# Patient Record
Sex: Female | Born: 1935 | Race: White | Hispanic: No | Smoking: Never smoker
Health system: Southern US, Community
[De-identification: ages and names within clinical notes are randomized; demographics above are authoritative.]

## PROBLEM LIST (undated history)

## (undated) ENCOUNTER — Emergency Department (HOSPITAL_BASED_OUTPATIENT_CLINIC_OR_DEPARTMENT_OTHER): Discharge status: Absent without leave | Payer: Self-pay

## (undated) DIAGNOSIS — G709 Myoneural disorder, unspecified: Secondary | ICD-10-CM

## (undated) DIAGNOSIS — L723 Sebaceous cyst: Secondary | ICD-10-CM

## (undated) DIAGNOSIS — I639 Cerebral infarction, unspecified: Secondary | ICD-10-CM

## (undated) DIAGNOSIS — F0390 Unspecified dementia without behavioral disturbance: Secondary | ICD-10-CM

## (undated) DIAGNOSIS — I10 Essential (primary) hypertension: Secondary | ICD-10-CM

## (undated) DIAGNOSIS — S065X9A Traumatic subdural hemorrhage with loss of consciousness of unspecified duration, initial encounter: Secondary | ICD-10-CM

## (undated) DIAGNOSIS — G9340 Encephalopathy, unspecified: Secondary | ICD-10-CM

## (undated) DIAGNOSIS — E785 Hyperlipidemia, unspecified: Secondary | ICD-10-CM

## (undated) DIAGNOSIS — S065XAA Traumatic subdural hemorrhage with loss of consciousness status unknown, initial encounter: Secondary | ICD-10-CM

## (undated) DIAGNOSIS — T7840XA Allergy, unspecified, initial encounter: Secondary | ICD-10-CM

## (undated) HISTORY — PX: CYSTECTOMY: SUR359

## (undated) HISTORY — DX: Hyperlipidemia, unspecified: E78.5

## (undated) HISTORY — PX: COLONOSCOPY: SHX174

## (undated) HISTORY — DX: Myoneural disorder, unspecified: G70.9

## (undated) HISTORY — PX: ABDOMINAL HYSTERECTOMY: SHX81

## (undated) HISTORY — DX: Allergy, unspecified, initial encounter: T78.40XA

## (undated) HISTORY — DX: Essential (primary) hypertension: I10

## (undated) HISTORY — DX: Sebaceous cyst: L72.3

## (undated) HISTORY — DX: Cerebral infarction, unspecified: I63.9

---

## 1998-06-01 ENCOUNTER — Other Ambulatory Visit: Admission: RE | Admit: 1998-06-01 | Discharge: 1998-06-01 | Payer: Self-pay | Admitting: Obstetrics and Gynecology

## 1999-04-21 ENCOUNTER — Encounter: Payer: Self-pay | Admitting: Emergency Medicine

## 1999-04-21 ENCOUNTER — Emergency Department (HOSPITAL_COMMUNITY): Admission: EM | Admit: 1999-04-21 | Discharge: 1999-04-21 | Payer: Self-pay

## 1999-10-26 ENCOUNTER — Other Ambulatory Visit: Admission: RE | Admit: 1999-10-26 | Discharge: 1999-10-26 | Payer: Self-pay | Admitting: Obstetrics and Gynecology

## 1999-10-31 ENCOUNTER — Encounter: Payer: Self-pay | Admitting: Obstetrics and Gynecology

## 1999-10-31 ENCOUNTER — Encounter: Admission: RE | Admit: 1999-10-31 | Discharge: 1999-10-31 | Payer: Self-pay | Admitting: Obstetrics and Gynecology

## 2000-11-25 ENCOUNTER — Other Ambulatory Visit: Admission: RE | Admit: 2000-11-25 | Discharge: 2000-11-25 | Payer: Self-pay | Admitting: Obstetrics and Gynecology

## 2001-12-18 ENCOUNTER — Encounter: Payer: Self-pay | Admitting: Obstetrics and Gynecology

## 2001-12-18 ENCOUNTER — Encounter: Admission: RE | Admit: 2001-12-18 | Discharge: 2001-12-18 | Payer: Self-pay | Admitting: Obstetrics and Gynecology

## 2001-12-23 ENCOUNTER — Other Ambulatory Visit: Admission: RE | Admit: 2001-12-23 | Discharge: 2001-12-23 | Payer: Self-pay | Admitting: Obstetrics and Gynecology

## 2002-01-02 ENCOUNTER — Encounter: Payer: Self-pay | Admitting: Obstetrics and Gynecology

## 2002-01-02 ENCOUNTER — Encounter: Admission: RE | Admit: 2002-01-02 | Discharge: 2002-01-02 | Payer: Self-pay | Admitting: Obstetrics and Gynecology

## 2002-08-10 ENCOUNTER — Other Ambulatory Visit: Admission: RE | Admit: 2002-08-10 | Discharge: 2002-08-10 | Payer: Self-pay | Admitting: Hematology & Oncology

## 2002-12-25 ENCOUNTER — Encounter: Payer: Self-pay | Admitting: Obstetrics and Gynecology

## 2002-12-25 ENCOUNTER — Encounter: Admission: RE | Admit: 2002-12-25 | Discharge: 2002-12-25 | Payer: Self-pay | Admitting: Obstetrics and Gynecology

## 2006-09-21 ENCOUNTER — Emergency Department (HOSPITAL_COMMUNITY): Admission: EM | Admit: 2006-09-21 | Discharge: 2006-09-21 | Payer: Self-pay | Admitting: Emergency Medicine

## 2007-06-21 ENCOUNTER — Inpatient Hospital Stay (HOSPITAL_COMMUNITY): Admission: EM | Admit: 2007-06-21 | Discharge: 2007-06-25 | Payer: Self-pay | Admitting: Emergency Medicine

## 2007-06-23 ENCOUNTER — Encounter (INDEPENDENT_AMBULATORY_CARE_PROVIDER_SITE_OTHER): Payer: Self-pay | Admitting: Internal Medicine

## 2007-06-25 ENCOUNTER — Ambulatory Visit: Payer: Self-pay | Admitting: Physical Medicine & Rehabilitation

## 2007-11-21 ENCOUNTER — Ambulatory Visit: Payer: Self-pay | Admitting: Gastroenterology

## 2007-11-26 ENCOUNTER — Ambulatory Visit: Payer: Self-pay | Admitting: Gastroenterology

## 2007-11-26 ENCOUNTER — Encounter: Payer: Self-pay | Admitting: Gastroenterology

## 2007-12-01 ENCOUNTER — Telehealth: Payer: Self-pay | Admitting: Gastroenterology

## 2007-12-22 DIAGNOSIS — E119 Type 2 diabetes mellitus without complications: Secondary | ICD-10-CM | POA: Insufficient documentation

## 2007-12-22 DIAGNOSIS — I10 Essential (primary) hypertension: Secondary | ICD-10-CM | POA: Insufficient documentation

## 2007-12-22 DIAGNOSIS — Z8679 Personal history of other diseases of the circulatory system: Secondary | ICD-10-CM | POA: Insufficient documentation

## 2007-12-24 ENCOUNTER — Ambulatory Visit: Payer: Self-pay | Admitting: Gastroenterology

## 2007-12-24 DIAGNOSIS — R197 Diarrhea, unspecified: Secondary | ICD-10-CM

## 2007-12-24 DIAGNOSIS — L290 Pruritus ani: Secondary | ICD-10-CM

## 2008-03-04 ENCOUNTER — Emergency Department (HOSPITAL_COMMUNITY): Admission: EM | Admit: 2008-03-04 | Discharge: 2008-03-04 | Payer: Self-pay | Admitting: Emergency Medicine

## 2010-12-19 NOTE — Letter (Signed)
November 21, 2007    Larina Earthly, M.D.  [address]  [place], [place] [address]   RE:  Dorothy Page, Dorothy Page  MRN:  [id number]  /  DOB:  1935-11-21   Dear Dr. Felipa Eth:   Upon your kind referral, I had the pleasure of evaluating your patient  and I am pleased to offer my findings.  I saw Dorothy Page in the  office today.  Enclosed is a copy of my progress note that details my  findings and recommendations.   Thank you for the opportunity to participate in your patient's care.    Sincerely,      Barbette Hair. Arlyce Dice, MD,FACG  Electronically Signed    RDK/MedQ  DD: 11/21/2007  DT: 11/21/2007  Job #: [id number]

## 2010-12-19 NOTE — Discharge Summary (Signed)
Dorothy Page, Dorothy Page NO.:  [id number]   MEDICAL RECORD NO.:  [id number]          PATIENT TYPE:  INP   LOCATION:  3019                         FACILITY:  [facility]   PHYSICIAN:  Larina Earthly, M.D.        DATE OF BIRTH:  [birth year]   DATE OF ADMISSION:  06/21/2007  DATE OF DISCHARGE:  06/25/2007                               DISCHARGE SUMMARY   DISCHARGE DIAGNOSES:  1. Likely small vessel infarct on the left not seen on multiple MRIs      associated with hemiballismic movements of the right upper greater      than the right lower extremities and mild short-term memory      deficits currently being discharged on Plavix, workup as below.  2. Type 2 diabetes mellitus uncontrolled on insulin,      thiazolidinedione, and metformin.  3. Hypertension controlled on ACE inhibitor and low-dose thiazide      diuretic.  4. Hyperlipidemia with history of noncompliance with statin therapy,      to be discharged on statin therapy.  5. Mood disorder, to be discharged on Celexa.   DISPOSITION:  Pending physical therapy consultation but with  uncontrolled movements of the right greater than lower extremities with  cessation of driving and work ordered by Dr. Pearlean Brownie for at least 1-2  months pending followup with Dr. Pearlean Brownie on an outpatient basis.  The  patient is to be discharged home with daughter who will provide bedroom  suite on the first floor of her home.   DISCHARGE MEDICATIONS:  1. Plavix 75 mg p.o. daily.  2. Lantus 78 units subcu q.h.s.  3. Lotensin 20 mg in the morning.  4. Hydrochlorothiazide 12.5 mg a q.a.m.  5. Glucophage 1000 mg b.i.d. with an additional 500 mg dose at      lunchtime.  6. Avandia 4 mg p.o. daily.  7. Klonopin 0.5 mg t.i.d.   FOLLOW UP:  The patient will follow up with Dr. Pearlean Brownie in approximately 2-  3 months and with myself, Dr. Felipa Eth, in 1 month.  Phone numbers provided  to the patient and family, and again with restrictions on driving and no  work  for at least 1 month pending further evaluation by Dr. Pearlean Brownie.   DISCHARGE LABS:  Sodium 137, potassium 3.6, BUN 11, creatinine 0.9.  Fasting blood sugar 71.  Total cholesterol 246, with an LDL of 179, HDL  35, and triglycerides 160.  White blood cell count 9.8, hemoglobin 14.7,  hematocrit 42.4%, platelet count 282.  Hemoglobin A1c 8.8%.  TSH 2.054.  B12 normal at 298.  RPR negative.  Homocysteine normal at 10.4.   Brain MRI without contrast on June 23, 2007 reveals no acute  intracranial abnormality, chronic small vessel ischemic disease  suspected with involvement of the cerebral white matter and probable  lacunar infarct in the left globus pallidus.  No definite thalamic  involvement to explain the hemiballismic movement.  MR angiogram  degraded by motion artifact but no major circle of Willis branch  occlusion noted.  CT of the head without contrast on admission to  the  emergency room revealed atrophy appropriate for age with no acute  intracranial abnormality.  2-D echocardiogram pending.  EEG:  Final  report pending.  Carotid Dopplers on June 23, 2007 reveal 46% right  ICA stenosis, mid range of the scale, ECA stenosis, vertebral artery  flow was antegrade, left 60-80% ICA stenosis, low range of scale; and  again, vertebral artery flow is antegrade.   HISTORY AND PHYSICAL:  For history and physical, please see history of  physical dictated by Dr. Guerry Bruin on June 21, 2007 as well as  a consultation by Dr. Deneen Harts on June 21, 2007 for extensive  details.  Briefly, this is a 75 year old Caucasian female with a history  of poorly controlled type 2 diabetes mellitus, hypertension,  hyperlipidemia, followed by research protocol consisting of the ACCORD  trial at [facility].  She was in usual state of  health working part-time at a nursery as an Airline pilot and was out  shopping on the day of admission when family members noted that she  was  having difficulty in moving her right hand that started off as  involuntary jerks and movements.  She was unable to hold a phone.  She  was subsequently taken to [facility] emergency room where neurology declined  to admit but offered to consult, finding that her CT scan and MRI were  negative and that she did not fit code stroke criteria.  The patient was  subsequently admitted for further evaluation and management.  Admission  note also notes considerable amount of stress in the patient's life.   HOSPITAL COURSE:  The patient was admitted and had multiple MRIs with  considerable amounts of movement artifact despite moderate doses of  benzodiazepines to suppress involuntary movements.  Despite these  interventions, MRI failed to yield any evidence of an acute stroke  despite physical exam that suggested the same.  She was started on  Plavix.  It was noted that her cardiovascular risk factors were  uncontrolled, specifically with respect to diabetic control, lipid  control; however, her blood pressure remained stable.  She was placed on  home doses of Lantus insulin with this and adequate caloric intake.  She  did suffer some hypoglycemia, suggesting issues with compliance at home  on her home regimen.  Workup was conducted, as above.  She was followed  by neurology throughout her hospitalization again who noted a likely  small infarct that was not seen on the MRI.  Given some memory  difficulties and continued hemiballismic movements, they thought it was  appropriate that she be discharged on Plavix.  They also recommended PT  evaluation prior to discharge and adequate safety measures for the  patient upon discharge, including no driving and no work for  approximately 1-2 months.  They also recommended Klonopin for the  hemiballismic movements, initially started at b.i.d. treatment and  increasing to 0.5 mg t.i.d.  The patient is now planned for discharge on  June 25, 2007 pending  results of PT consultation, EEG, and 2-D  echocardiogram which have all been performed.      Larina Earthly, M.D.  Electronically Signed     RA/MEDQ  D:  06/24/2007  T:  06/24/2007  Job:  [id number]   cc:   Pramod P. Pearlean Brownie, MD

## 2010-12-19 NOTE — Assessment & Plan Note (Signed)
[facility]                         GASTROENTEROLOGY OFFICE NOTE   Dorothy Page, Dorothy Page                     MRN:          [id number]  DATE:11/21/2007                            DOB:          [birth year]    REFERRING PHYSICIAN:  Larina Earthly, M.D.   REASON FOR CONSULTATION:  Diarrhea.   Dorothy Page is a 75 year old white female, referred through the courtesy  of Dr. Felipa Eth for evaluation.  Since her stroke in November, 2008, she has  developed change in bowel habits, consisting of diarrhea.  On a typical  day, she may have several loose to watery stools.  She has some urgency.  She will occasionally awaken at night to defecate.  There is no history  of melena or hematochezia.  She was started on Aricept at the time of  her stroke, as well as Plavix.  Weight has been stable.  She has been on  no antibiotics.  She also was started on Celexa at the time of her  stroke.   PAST MEDICAL HISTORY:  Pertinent for diabetes.  She has hypertension.  She has had seizures with the stroke.  She is status post hysterectomy.   FAMILY HISTORY:  Pertinent for parents with diabetes.   MEDICATIONS INCLUDE:  Metformin, Zocor, Aricept, Celexa, Plavix and  Lotensin.   ALLERGIES:  She is allergic to ASPIRIN.   SOCIAL HISTORY:  She neither smokes nor drinks.  She is divorced and  retired.   REVIEW OF SYSTEMS:  Was reviewed and is positive for sleeping problems,  excessive thirst, nocturia and some urinary incontinence.   PHYSICAL EXAMINATION:  She is a healthy-appearing female.  Pulse 60, blood pressure 138/70, weight 175.  HEENT: EOMI.  PERRLA.  Sclerae are anicteric.  Conjunctivae are pink.  NECK:  Supple without thyromegaly, adenopathy or carotid bruits.  CHEST:  Clear to auscultation and percussion without adventitious  sounds.  CARDIAC:  Regular rhythm; normal S1 S2.  There are no murmurs, gallops  or rubs.  ABDOMEN:  Bowel sounds are normoactive.  Abdomen is soft,  nontender and  nondistended.  There are no abdominal masses, tenderness, splenic  enlargement or hepatomegaly.  EXTREMITIES:  Full range of motion.  No cyanosis, clubbing or edema.  RECTAL:  Deferred.  SKELETAL EXAM:  There are no skeletal abnormalities.  NEUROLOGIC EXAM:  She is alert and oriented times three and is nonfocal.   IMPRESSION:  Diarrhea.  Her diarrhea may be medicine-related.  Plavix,  Aricept and Celexa were all started at the time of her stroke, which  coincides with the onset of her diarrhea.  A structural lesion of the  colon and microscopic colitis are other possibilities.   RECOMMENDATION:  1. Colonoscopy.  2. We will try holding her medications, one at a time, to see if has      some impact on her diarrhea.     Barbette Hair. Arlyce Dice, MD,FACG  Electronically Signed    RDK/MedQ  DD: 11/21/2007  DT: 11/21/2007  Job #: [id number]   cc:   Larina Earthly, M.D.

## 2010-12-19 NOTE — Consult Note (Signed)
NAMECHANNAH, Dorothy Page NO.:  [id number]   MEDICAL RECORD NO.:  [id number]          PATIENT TYPE:  EMS   LOCATION:  MAJO                         FACILITY:  [facility]   PHYSICIAN:  Bevelyn Buckles. Champey, M.D.DATE OF BIRTH:  23-Mar-1936   DATE OF CONSULTATION:  06/21/2007  DATE OF DISCHARGE:                                 CONSULTATION   NEUROLOGY CONSULTATION:   REQUESTING PHYSICIAN:  Dr. Rosalia Hammers.   REASON FOR CONSULTATION:  Stroke versus seizures.   HISTORY OF PRESENT ILLNESS:  Dorothy Page is a 75 year old Caucasian female  with multiple medical problems who presents with right involuntary  shaking since 5 p.m.  Patient was in her normal state of health and  started feeling lightheaded and nauseated as per daughter.  This led to  involuntary and regular shaking on the right side of her body, more so  on the right arm than leg.  She was given Ativan in the ED, which  greatly improved her symptoms.  She is still slightly having slight,  slowed shaking, questionable choreiform movement, which is greatly  improved as per daughter.  Patient did have head trauma and fall 3 weeks  ago when she fell and hit her head.  She did not seek medical care at  that time.  Daughter states that she has also been slightly confused  with decreased memory as of late.  She denies any headaches, vision  changes, speech changes, swallowing problems, chewing problems, vertigo  or loss of consciousness.   PAST MEDICAL HISTORY:  Past medical history is positive for Guillain-  Barre, hypertension, diabetes.   CURRENT MEDICATIONS:  Current medications include Lantus, metformin,  Lotensin.   ALLERGIES:  ASPIRIN, which causes anaphylaxis.   FAMILY HISTORY:  Family history is positive for hypertension and  strokes.   SOCIAL HISTORY:  Patient lives alone, denies any tobacco, rarely drinks  alcohol.   REVIEW OF SYSTEMS:  Positive as per History of Present Illness.  Review  of systems negative except  as per the History of Present Illness, and  greater than 6 other organ systems.   PHYSICAL EXAMINATION:  VITALS:  Temperature is 97.0, blood pressure is  180/80, pulse is 100, respirations 20, O2 sat is 97%.  HEENT:  Normocephalic, atraumatic.  Extraocular muscles are intact.  Pupils equal, round and reactive to light.  NECK:  Supple, no carotid bruits.  HEART:  Regular.  LUNGS:  Clear.  ABDOMEN:  Soft.  EXTREMITIES:  Good pulses.  NEUROLOGIC:  Patient is awake, alert and oriented.  Language is fluent.  The patient is following commands appropriately.  Cranial nerves II-XII  are grossly intact.  Motor examination:  Patient has good strength and  normal tone in all 4 extremities.  No drift is noted.  Patient  occasionally has slow involuntary movements of the right upper  extremity, greater than the lower extremity, at times.  Physical  examination is within normal limits.  There is no extinction  or neglect  is seen.  Reflexes are 1+ and symmetric.  Cerebellar function is within  normal limits, finger-to-nose, heel-to-shin.  Gait is  not assessed  secondary to safety.  NIH stroke scale is 0.   LABORATORY DATA:  CT of the head showed no acute abnormalities.  MRI of  the brain, limited, showed negative acute infarct on diffusion-weighted  images.  Labs are currently pending.   IMPRESSION:  A 75 year old with right-sided involuntary shaking which is  improved with Ativan.  Patient not a candidate for IV tPA as her  symptoms have improved.  Questionable seizure-like activity, recent head  injury/trauma, NIH stroke scale of 0 with a negative MRI, diffusion.  I  still have a suspicion for possible small vessel left infarct versus  seizures causing her involuntary symptoms.  I recommend repeating her  MRI/MRA of the brain in the a.m. and checking an EEG on Monday.  I would  recommend starting Plavix 75 mg per day as the patient has risk factors  for stroke, and recommend continuing Ativan  as needed for her  involuntary movements.  We will follow the patient as consultants.      Bevelyn Buckles. Nash Shearer, M.D.  Electronically Signed     DRC/MEDQ  D:  06/21/2007  T:  06/21/2007  Job:  [id number]

## 2010-12-19 NOTE — Letter (Signed)
November 21, 2007    Ms. Jeanella Craze   RE:  JRUE, YAMBAO  MRN:  [id number]  /  DOB:  July 22, 1936   Dear Ms. Gane:   It is my pleasure to have treated you recently as a new patient in my  office.  I appreciate your confidence and the opportunity to participate  in your care.   Since I do have a busy inpatient endoscopy schedule and office schedule,  my office hours vary weekly.  I am, however, available for emergency  calls every day through my office.  If I cannot promptly meet an urgent  office appointment, another one of our gastroenterologists will be able  to assist you.   My well-trained staff are prepared to help you at all times.  For  emergencies after office hours, a physician from our gastroenterology  section is always available through my 24-hour answering service.   While you are under my care, I encourage discussion of your questions  and concerns, and I will be happy to return your calls as soon as I am  available.   Once again, I welcome you as a new patient and I look forward to a happy  and healthy relationship.    Sincerely,      Barbette Hair. Arlyce Dice, MD,FACG  Electronically Signed   RDK/MedQ  DD: 11/21/2007  DT: 11/21/2007  Job #: [id number]
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
## 2010-12-19 NOTE — Procedures (Signed)
EEG NUMBER:  [id number]   CLINICAL HISTORY:  The patient is a 75 year old who had difficulty  moving her right hand, involuntary jerks and movement.  The study is  being done to look for the presence of seizures. (781.0)   PROCEDURE:  The tracing is carried out on a 32-channel digital Cadwell  recorder, re-formatted into 16 channel montages, with one devoted to  EKG.  The patient is awake and drowsy during the recording.   MEDICATIONS:  Include NovoLog, Lotensin, hydrochlorothiazide, Protonix,  Plavix, Glucophage, Lantus, Zocor and Ativan.  The [employer] 10-  system lead placement was used.   DESCRIPTION OF FINDINGS:  Dominant frequency is an 8-Hz, 25 to 30-  microvolt activity, with superimposed upper theta range activity.  Toward the end of the record, the patient has predominantly theta range  activity associated with sleep spindles.  Photic stimulation and  hyperventilation caused little change in background.   EKG showed a regular sinus rhythm with a ventricular response of 72  beats per minute.   IMPRESSION:  Normal record, awake and asleep.      Deanna Artis. Sharene Skeans, M.D.  Electronically Signed     ZOX:WRUE  D:  06/23/2007 23:35:25  T:  06/24/2007 12:44:18  Job #:  [id number]   cc:   Gaspar Garbe, M.D.  Fax: [phone]

## 2010-12-22 NOTE — Discharge Summary (Signed)
Dorothy Page, ROWTON NO.:  [id number]   MEDICAL RECORD NO.:  [id number]          PATIENT TYPE:  INP   LOCATION:  3019                         FACILITY:  [facility]   PHYSICIAN:  Larina Earthly, M.D.        DATE OF BIRTH:  October 18, 1935   DATE OF ADMISSION:  06/21/2007  DATE OF DISCHARGE:  06/25/2007                               DISCHARGE SUMMARY   ADDENDUM   The patient was seen by physical therapy and given multiple falls and  instability with her gait as well as short-term memory deficits and  difficulty onto standing safety instructions, their recommendation was  that she have 24-hour supervision upon discharge or be admitted to a  rehab facility.  She did have a rehab consult.  After talking with  family, the social worker realized that this patient will have newly  arranged 24-hour supervision at home with the daughter as previously  dictated.  They also further recommend discharge with speech therapy  with occupational and physical therapy in addition to her 24-hour  supervision.  This was performed on the evening June 25, 2007.  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 and suggested changing over to Haldol 0.5 mg  p.o. t.i.d.  The prescriptions were called into [facility] on [address] consisting of Haldol 0.5 mg one p.o. every 8 hours scheduled (#30  with one refill), Ativan 0.5 mg every 8 hours p.r.n. for anxiety  disorder and mood disorder (#30 with one refill), Plavix 75 mg each day  (#30 with six refills) and Celexa 20 mg each day (#30 with six refills).  The patient will followup as previously dictated.      Larina Earthly, M.D.  Electronically Signed     RA/MEDQ  D:  06/26/2007  T:  06/27/2007  Job:  [id number]

## 2011-02-09 ENCOUNTER — Ambulatory Visit (INDEPENDENT_AMBULATORY_CARE_PROVIDER_SITE_OTHER): Payer: Medicare HMO | Admitting: Surgery

## 2011-02-09 ENCOUNTER — Encounter (INDEPENDENT_AMBULATORY_CARE_PROVIDER_SITE_OTHER): Payer: Self-pay | Admitting: Surgery

## 2011-02-09 VITALS — BP 154/86 | HR 66 | Temp 95.6°F | Ht 65.0 in | Wt 167.0 lb

## 2011-02-09 DIAGNOSIS — L723 Sebaceous cyst: Secondary | ICD-10-CM

## 2011-02-09 NOTE — Patient Instructions (Signed)
Remove packing tomorrow and treat the wound daily with Neosporin.  Take the antibiotics prescribed by Dr. Felipa Eth.

## 2011-02-09 NOTE — Progress Notes (Signed)
Chief Complaint  Patient presents with  . Abscess    Right breast   History of present illness This is a 75 year old female who presents with a 10 year history of a slowly enlarging mass on the medial part of her right breast near the sternum. This has never previously become infected. She has had annual mammograms that never revealed any abnormalities in this area. She states that about 8 years ago it did burst, draining some whitish fluid. However over the last few days it has become inflamed and very tender. She was evaluated by Dr. Felipa Eth earlier today who referred her to the urgent office.  Past Medical History  Diagnosis Date  . Diabetes mellitus   . Allergy   . Stroke   . Neuromuscular disorder     mild paralysis  . Hypertension   . Hyperlipidemia     borderline   Past Surgical History  Procedure Date  . Abdominal hysterectomy   . Colonoscopy    Allergies  Allergen Reactions  . Aspirin     REACTION: swelling   See medication list  On physical examination, the patient has a 3 cm erythematous protruding mass on the right chest at the medial edge of the right breast. This appears to be an infected sebaceous cyst.  Impression: Infected sebaceous cyst right chest Plan: We prepped this area with Betadine and anesthetized with 1% lidocaine. I made a 1 cm transverse incision. A large amount of purulent drainage and sebum was expressed from the wound. Part of the cyst wall also was removed. The wound was packed with quarter inch Nu Gauze. The patient is instructed to remove the Nu Gauze tomorrow and to treat this area with Neosporin ointment. We will reevaluate her in 2 weeks. She should take the antibiotics prescribed to her earlier today.

## 2011-02-20 ENCOUNTER — Ambulatory Visit (INDEPENDENT_AMBULATORY_CARE_PROVIDER_SITE_OTHER): Payer: Medicare HMO | Admitting: Surgery

## 2011-02-20 ENCOUNTER — Encounter (INDEPENDENT_AMBULATORY_CARE_PROVIDER_SITE_OTHER): Payer: Self-pay | Admitting: Surgery

## 2011-02-20 VITALS — Temp 98.2°F

## 2011-02-20 DIAGNOSIS — L723 Sebaceous cyst: Secondary | ICD-10-CM

## 2011-02-20 DIAGNOSIS — L089 Local infection of the skin and subcutaneous tissue, unspecified: Secondary | ICD-10-CM

## 2011-02-20 NOTE — Patient Instructions (Signed)
Remove packing tomorrow.  Every day, use a cotton swab with some Neosporin.  Twirl the cotton swab in the wound, then cover the wound with a dry dressing.

## 2011-02-20 NOTE — Progress Notes (Signed)
Status post drainage of an infected sebaceous cyst on her chest. The area is much smaller but the skin edges seem to have almost healed up completely. There is a tiny bit of drainage from a small pinhole. We packed this wound with Betadine and anesthetized with 1% lidocaine. I opened the incision and we expressed a little bit more infected sebum. I debrided some of the cyst wall. We packed the wound with quarter-inch Nu Gauze. She is to remove this tomorrow and treat the wound with a cotton swab and Neosporin. Recheck in one to 2 weeks.

## 2011-03-01 ENCOUNTER — Encounter (INDEPENDENT_AMBULATORY_CARE_PROVIDER_SITE_OTHER): Payer: Self-pay

## 2011-03-01 ENCOUNTER — Encounter (INDEPENDENT_AMBULATORY_CARE_PROVIDER_SITE_OTHER): Payer: Medicare HMO | Admitting: Surgery

## 2011-03-01 ENCOUNTER — Ambulatory Visit (INDEPENDENT_AMBULATORY_CARE_PROVIDER_SITE_OTHER): Payer: Medicare HMO | Admitting: Surgery

## 2011-03-01 ENCOUNTER — Encounter (INDEPENDENT_AMBULATORY_CARE_PROVIDER_SITE_OTHER): Payer: Self-pay | Admitting: Surgery

## 2011-03-01 DIAGNOSIS — L723 Sebaceous cyst: Secondary | ICD-10-CM

## 2011-03-01 DIAGNOSIS — L089 Local infection of the skin and subcutaneous tissue, unspecified: Secondary | ICD-10-CM

## 2011-03-01 NOTE — Progress Notes (Signed)
The patient returns for recheck of her sebaceous cyst. The wound is completely healed with no sign of infection. She still has a residual cyst measuring about 2 cm across. We will plan to excise this completely while it is not inflamed or infected. We will schedule her for an office procedure in 2 weeks. No need for any dressings at this time.

## 2011-03-13 ENCOUNTER — Ambulatory Visit (INDEPENDENT_AMBULATORY_CARE_PROVIDER_SITE_OTHER): Payer: Medicare HMO | Admitting: Surgery

## 2011-03-13 ENCOUNTER — Ambulatory Visit (INDEPENDENT_AMBULATORY_CARE_PROVIDER_SITE_OTHER): Payer: Medicare HMO

## 2011-03-13 DIAGNOSIS — L723 Sebaceous cyst: Secondary | ICD-10-CM

## 2011-03-13 MED ORDER — HYDROCODONE-ACETAMINOPHEN 5-500 MG PO TABS: 1.0000 | ORAL_TABLET | ORAL | Status: DC | PRN

## 2011-03-13 NOTE — Patient Instructions (Signed)
Remove plastic dressing and gauze on Thursday.  Then you may shower over the steristrips.

## 2011-03-13 NOTE — Progress Notes (Signed)
Excision of sebaceous cyst - chest I prepped with Betadine and anesthetized with 1% Lidocaine.  A 2-cm transverse incision was made.  The cyst cavity still has some purulent fluid and some sebum. We debrided most of the cyst wall.  The wound was closed with subcuticular 4-0 Monocryl, Benzoin, and steri-strips.  A tegaderm dressing was applied.  Follow-up in 1 week.

## 2011-03-19 ENCOUNTER — Ambulatory Visit (INDEPENDENT_AMBULATORY_CARE_PROVIDER_SITE_OTHER): Payer: Medicare HMO | Admitting: Surgery

## 2011-03-19 ENCOUNTER — Encounter (INDEPENDENT_AMBULATORY_CARE_PROVIDER_SITE_OTHER): Payer: Self-pay | Admitting: Surgery

## 2011-03-19 DIAGNOSIS — L723 Sebaceous cyst: Secondary | ICD-10-CM

## 2011-03-19 DIAGNOSIS — L089 Local infection of the skin and subcutaneous tissue, unspecified: Secondary | ICD-10-CM

## 2011-03-19 NOTE — Patient Instructions (Signed)
  Remove the steri-strips in one week

## 2011-03-19 NOTE — Progress Notes (Signed)
Wound check of the excision of her sebaceous cyst. The wound seems to be healing well with no sign of infection. Minimal swelling around the incision. A left stationed in place for one more week. She may return on a p.r.n. basis.

## 2011-05-15 LAB — BASIC METABOLIC PANEL
BUN: 10
BUN: 11
CO2: 27
Calcium: 8.9
Chloride: 109
Creatinine, Ser: 0.69
Creatinine, Ser: 0.91
GFR calc Af Amer: >60
GFR calc non Af Amer: >60
GFR calc non Af Amer: >60
GFR calc non Af Amer: >60
Glucose, Bld: 56 — ABNORMAL LOW
Glucose, Bld: 69 — ABNORMAL LOW
Potassium: 3.5
Sodium: 141

## 2011-05-15 LAB — DIFFERENTIAL: Neutrophils Relative %: 85 — ABNORMAL HIGH

## 2011-05-15 LAB — LIPID PANEL
HDL: 35 — ABNORMAL LOW
Triglycerides: 160 — ABNORMAL HIGH
VLDL: 32

## 2011-05-15 LAB — VITAMIN B12: Vitamin B-12: 298 (ref 211–911)

## 2011-05-15 LAB — PROTIME-INR: Prothrombin Time: 12.8

## 2011-05-15 LAB — HEMOGLOBIN A1C: Hgb A1c MFr Bld: 8.8 — ABNORMAL HIGH

## 2011-05-15 LAB — HOMOCYSTEINE: Homocysteine: 10.4

## 2011-05-15 LAB — COMPREHENSIVE METABOLIC PANEL
ALT: 30
AST: 36
Albumin: 3.7
Alkaline Phosphatase: 85
CO2: 25
Calcium: 8.9
Chloride: 106
Creatinine, Ser: 0.71
Glucose, Bld: 167 — ABNORMAL HIGH
Potassium: 4.1
Sodium: 138
Total Protein: 6.8

## 2011-05-15 LAB — CBC
HCT: 42.4
HCT: 42.8
Hemoglobin: 15.2 — ABNORMAL HIGH
MCHC: 33.8
MCV: 87.6
Platelets: 262
RDW: 13.9
WBC: 9.8

## 2011-05-15 LAB — TSH: TSH: 2.054

## 2011-05-15 LAB — CK TOTAL AND CKMB (NOT AT ARMC)
Relative Index: 1.5
Total CK: 236 — ABNORMAL HIGH

## 2013-11-27 ENCOUNTER — Other Ambulatory Visit: Payer: Self-pay | Admitting: Internal Medicine

## 2013-11-27 DIAGNOSIS — Z1231 Encounter for screening mammogram for malignant neoplasm of breast: Secondary | ICD-10-CM

## 2013-12-07 ENCOUNTER — Ambulatory Visit: Payer: Self-pay

## 2014-04-19 ENCOUNTER — Encounter: Payer: Self-pay | Admitting: Gastroenterology

## 2014-12-19 ENCOUNTER — Other Ambulatory Visit (HOSPITAL_COMMUNITY): Payer: Self-pay

## 2014-12-19 ENCOUNTER — Inpatient Hospital Stay (HOSPITAL_COMMUNITY)
Admission: EM | Admit: 2014-12-19 | Discharge: 2014-12-21 | DRG: 085 | Disposition: A | Discharge status: Skilled Nursing Facility | Payer: Commercial Managed Care - HMO | Attending: Internal Medicine | Admitting: Internal Medicine

## 2014-12-19 ENCOUNTER — Encounter (HOSPITAL_COMMUNITY): Payer: Self-pay | Admitting: Emergency Medicine

## 2014-12-19 ENCOUNTER — Emergency Department (HOSPITAL_COMMUNITY): Payer: Commercial Managed Care - HMO

## 2014-12-19 DIAGNOSIS — R2681 Unsteadiness on feet: Secondary | ICD-10-CM | POA: Diagnosis present

## 2014-12-19 DIAGNOSIS — E1165 Type 2 diabetes mellitus with hyperglycemia: Secondary | ICD-10-CM | POA: Diagnosis present

## 2014-12-19 DIAGNOSIS — W19XXXA Unspecified fall, initial encounter: Secondary | ICD-10-CM | POA: Diagnosis present

## 2014-12-19 DIAGNOSIS — I6202 Nontraumatic subacute subdural hemorrhage: Secondary | ICD-10-CM

## 2014-12-19 DIAGNOSIS — S065X9A Traumatic subdural hemorrhage with loss of consciousness of unspecified duration, initial encounter: Secondary | ICD-10-CM | POA: Diagnosis present

## 2014-12-19 DIAGNOSIS — I10 Essential (primary) hypertension: Secondary | ICD-10-CM | POA: Diagnosis present

## 2014-12-19 DIAGNOSIS — S065X0A Traumatic subdural hemorrhage without loss of consciousness, initial encounter: Secondary | ICD-10-CM | POA: Diagnosis not present

## 2014-12-19 DIAGNOSIS — Z79899 Other long term (current) drug therapy: Secondary | ICD-10-CM

## 2014-12-19 DIAGNOSIS — R4182 Altered mental status, unspecified: Secondary | ICD-10-CM | POA: Diagnosis present

## 2014-12-19 DIAGNOSIS — G9340 Encephalopathy, unspecified: Secondary | ICD-10-CM | POA: Diagnosis present

## 2014-12-19 DIAGNOSIS — F0390 Unspecified dementia without behavioral disturbance: Secondary | ICD-10-CM | POA: Diagnosis present

## 2014-12-19 DIAGNOSIS — Y92009 Unspecified place in unspecified non-institutional (private) residence as the place of occurrence of the external cause: Secondary | ICD-10-CM

## 2014-12-19 DIAGNOSIS — Y92129 Unspecified place in nursing home as the place of occurrence of the external cause: Secondary | ICD-10-CM

## 2014-12-19 DIAGNOSIS — Z8673 Personal history of transient ischemic attack (TIA), and cerebral infarction without residual deficits: Secondary | ICD-10-CM | POA: Diagnosis not present

## 2014-12-19 DIAGNOSIS — E538 Deficiency of other specified B group vitamins: Secondary | ICD-10-CM | POA: Diagnosis present

## 2014-12-19 DIAGNOSIS — Z7902 Long term (current) use of antithrombotics/antiplatelets: Secondary | ICD-10-CM | POA: Diagnosis not present

## 2014-12-19 DIAGNOSIS — E785 Hyperlipidemia, unspecified: Secondary | ICD-10-CM | POA: Diagnosis present

## 2014-12-19 DIAGNOSIS — S065XAA Traumatic subdural hemorrhage with loss of consciousness status unknown, initial encounter: Secondary | ICD-10-CM | POA: Diagnosis present

## 2014-12-19 HISTORY — DX: Unspecified dementia, unspecified severity, without behavioral disturbance, psychotic disturbance, mood disturbance, and anxiety: F03.90

## 2014-12-19 LAB — BASIC METABOLIC PANEL
Anion gap: 12 (ref 5–15)
BUN: 9 mg/dL (ref 6–20)
CHLORIDE: 105 mmol/L (ref 101–111)
CO2: 24 mmol/L (ref 22–32)
CREATININE: 0.93 mg/dL (ref 0.44–1.00)
Calcium: 9.3 mg/dL (ref 8.9–10.3)
GFR calc Af Amer: >60 mL/min (ref >60)
GFR calc non Af Amer: 58 mL/min — ABNORMAL LOW (ref >60)
GLUCOSE: 210 mg/dL — AB (ref 65–99)
Potassium: 4.2 mmol/L (ref 3.5–5.1)
Sodium: 141 mmol/L (ref 135–145)

## 2014-12-19 LAB — CBC WITH DIFFERENTIAL/PLATELET
BASOS ABS: 0 10*3/uL (ref 0.0–0.1)
BASOS PCT: 0 % (ref 0–1)
EOS PCT: 3 % (ref 0–5)
Eosinophils Absolute: 0.2 10*3/uL (ref 0.0–0.7)
HCT: 43 % (ref 36.0–46.0)
Hemoglobin: 14.4 g/dL (ref 12.0–15.0)
Lymphocytes Relative: 37 % (ref 12–46)
Lymphs Abs: 3.1 10*3/uL (ref 0.7–4.0)
MCH: 29.6 pg (ref 26.0–34.0)
MCHC: 33.5 g/dL (ref 30.0–36.0)
MCV: 88.3 fL (ref 78.0–100.0)
MONO ABS: 0.5 10*3/uL (ref 0.1–1.0)
Monocytes Relative: 6 % (ref 3–12)
Neutro Abs: 4.5 10*3/uL (ref 1.7–7.7)
Neutrophils Relative %: 54 % (ref 43–77)
PLATELETS: 212 10*3/uL (ref 150–400)
RBC: 4.87 MIL/uL (ref 3.87–5.11)
RDW: 13.1 % (ref 11.5–15.5)
WBC: 8.3 10*3/uL (ref 4.0–10.5)

## 2014-12-19 LAB — VITAMIN B12: Vitamin B-12: 118 pg/mL — ABNORMAL LOW (ref 180–914)

## 2014-12-19 LAB — URINALYSIS, ROUTINE W REFLEX MICROSCOPIC
Bilirubin Urine: NEGATIVE
Glucose, UA: 100 mg/dL — AB
HGB URINE DIPSTICK: NEGATIVE
KETONES UR: NEGATIVE mg/dL
NITRITE: NEGATIVE
PH: 7 (ref 5.0–8.0)
Protein, ur: NEGATIVE mg/dL
SPECIFIC GRAVITY, URINE: 1.009 (ref 1.005–1.030)
Urobilinogen, UA: 0.2 mg/dL (ref 0.0–1.0)

## 2014-12-19 LAB — URINE MICROSCOPIC-ADD ON

## 2014-12-19 LAB — RAPID URINE DRUG SCREEN, HOSP PERFORMED
AMPHETAMINES: NOT DETECTED
BENZODIAZEPINES: NOT DETECTED
Barbiturates: NOT DETECTED
Cocaine: NOT DETECTED
OPIATES: NOT DETECTED
Tetrahydrocannabinol: NOT DETECTED

## 2014-12-19 LAB — CK: Total CK: 219 U/L (ref 38–234)

## 2014-12-19 LAB — GLUCOSE, CAPILLARY
Glucose-Capillary: 146 mg/dL — ABNORMAL HIGH (ref 65–99)
Glucose-Capillary: 171 mg/dL — ABNORMAL HIGH (ref 65–99)

## 2014-12-19 LAB — TSH: TSH: 3.094 u[IU]/mL (ref 0.350–4.500)

## 2014-12-19 LAB — TROPONIN I: Troponin I: <0.03 ng/mL (ref <0.031)

## 2014-12-19 LAB — AMMONIA: Ammonia: 21 umol/L (ref 9–35)

## 2014-12-19 MED ORDER — MEMANTINE HCL ER 28 MG PO CP24
28.0000 mg | ORAL_CAPSULE | Freq: Every day | ORAL | Status: DC
  Administered 2014-12-19 – 2014-12-21 (×3): 28 mg via ORAL
  Filled 2014-12-19 (×3): qty 1

## 2014-12-19 MED ORDER — HALOPERIDOL LACTATE 5 MG/ML IJ SOLN: 5.0000 mg | Freq: Four times a day (QID) | INTRAMUSCULAR | Status: DC | PRN

## 2014-12-19 MED ORDER — SODIUM CHLORIDE 0.9 % IJ SOLN
3.0000 mL | Freq: Two times a day (BID) | INTRAMUSCULAR | Status: DC
  Administered 2014-12-19 – 2014-12-20 (×2): 3 mL via INTRAVENOUS

## 2014-12-19 MED ORDER — ACETAMINOPHEN 325 MG PO TABS: 650.0000 mg | ORAL_TABLET | Freq: Four times a day (QID) | ORAL | Status: DC | PRN

## 2014-12-19 MED ORDER — IRBESARTAN 150 MG PO TABS
75.0000 mg | ORAL_TABLET | Freq: Every day | ORAL | Status: DC
  Administered 2014-12-19 – 2014-12-21 (×3): 75 mg via ORAL
  Filled 2014-12-19 (×3): qty 1

## 2014-12-19 MED ORDER — INSULIN ASPART 100 UNIT/ML ~~LOC~~ SOLN
0.0000 [IU] | Freq: Three times a day (TID) | SUBCUTANEOUS | Status: DC
  Administered 2014-12-19: 1 [IU] via SUBCUTANEOUS
  Administered 2014-12-20 (×3): 2 [IU] via SUBCUTANEOUS
  Administered 2014-12-21: 3 [IU] via SUBCUTANEOUS

## 2014-12-19 MED ORDER — ONDANSETRON HCL 4 MG PO TABS: 4.0000 mg | ORAL_TABLET | Freq: Four times a day (QID) | ORAL | Status: DC | PRN

## 2014-12-19 MED ORDER — ACETAMINOPHEN 650 MG RE SUPP: 650.0000 mg | Freq: Four times a day (QID) | RECTAL | Status: DC | PRN

## 2014-12-19 MED ORDER — ONDANSETRON HCL 4 MG/2ML IJ SOLN: 4.0000 mg | Freq: Four times a day (QID) | INTRAMUSCULAR | Status: DC | PRN

## 2014-12-19 MED ORDER — ESCITALOPRAM OXALATE 10 MG PO TABS
5.0000 mg | ORAL_TABLET | ORAL | Status: DC
  Administered 2014-12-20 – 2014-12-21 (×2): 5 mg via ORAL
  Filled 2014-12-19 (×2): qty 1

## 2014-12-19 NOTE — Discharge Instructions (Signed)
You need to have your CT scan repeated in 10-14 days. Call Dr. Cassandria SanteeBotero's office to set that up.   Fall Prevention and Home Safety Falls cause injuries and can affect all age groups. It is possible to use preventive measures to significantly decrease the likelihood of falls. There are many simple measures which can make your home safer and prevent falls. OUTDOORS  Repair cracks and edges of walkways and driveways.  Remove high doorway thresholds.  Trim shrubbery on the main path into your home.  Have good outside lighting.  Clear walkways of tools, rocks, debris, and clutter.  Check that handrails are not broken and are securely fastened. Both sides of steps should have handrails.  Have leaves, snow, and ice cleared regularly.  Use sand or salt on walkways during winter months.  In the garage, clean up grease or oil spills. BATHROOM  Install night lights.  Install grab bars by the toilet and in the tub and shower.  Use non-skid mats or decals in the tub or shower.  Place a plastic non-slip stool in the shower to sit on, if needed.  Keep floors dry and clean up all water on the floor immediately.  Remove soap buildup in the tub or shower on a regular basis.  Secure bath mats with non-slip, double-sided rug tape.  Remove throw rugs and tripping hazards from the floors. BEDROOMS  Install night lights.  Make sure a bedside light is easy to reach.  Do not use oversized bedding.  Keep a telephone by your bedside.  Have a firm chair with side arms to use for getting dressed.  Remove throw rugs and tripping hazards from the floor. KITCHEN  Keep handles on pots and pans turned toward the center of the stove. Use back burners when possible.  Clean up spills quickly and allow time for drying.  Avoid walking on wet floors.  Avoid hot utensils and knives.  Position shelves so they are not too high or low.  Place commonly used objects within easy reach.  If  necessary, use a sturdy step stool with a grab bar when reaching.  Keep electrical cables out of the way.  Do not use floor polish or wax that makes floors slippery. If you must use wax, use non-skid floor wax.  Remove throw rugs and tripping hazards from the floor. STAIRWAYS  Never leave objects on stairs.  Place handrails on both sides of stairways and use them. Fix any loose handrails. Make sure handrails on both sides of the stairways are as long as the stairs.  Check carpeting to make sure it is firmly attached along stairs. Make repairs to worn or loose carpet promptly.  Avoid placing throw rugs at the top or bottom of stairways, or properly secure the rug with carpet tape to prevent slippage. Get rid of throw rugs, if possible.  Have an electrician put in a light switch at the top and bottom of the stairs. OTHER FALL PREVENTION TIPS  Wear low-heel or rubber-soled shoes that are supportive and fit well. Wear closed toe shoes.  When using a stepladder, make sure it is fully opened and both spreaders are firmly locked. Do not climb a closed stepladder.  Add color or contrast paint or tape to grab bars and handrails in your home. Place contrasting color strips on first and last steps.  Learn and use mobility aids as needed. Install an electrical emergency response system.  Turn on lights to avoid dark areas. Replace light bulbs that  burn out immediately. Get light switches that glow.  Arrange furniture to create clear pathways. Keep furniture in the same place.  Firmly attach carpet with non-skid or double-sided tape.  Eliminate uneven floor surfaces.  Select a carpet pattern that does not visually hide the edge of steps.  Be aware of all pets. OTHER HOME SAFETY TIPS  Set the water temperature for 120 F (48.8 C).  Keep emergency numbers on or near the telephone.  Keep smoke detectors on every level of the home and near sleeping areas. Document Released: 07/13/2002  Document Revised: 01/22/2012 Document Reviewed: 10/12/2011 [facility] Patient Information 2015 [place], [place]. This information is not intended to replace advice given to you by your health care provider. Make sure you discuss any questions you have with your health care provider.  Subdural Hematoma A subdural hematoma is a collection of blood between the brain and its tough outermost membrane covering (the dura). Blood clots that form in this area push down on the brain and cause irritation. A subdural hematoma may cause parts of the brain to stop working and eventually cause death.  CAUSES A subdural hematoma is caused by bleeding from a ruptured blood vessel (hemorrhage). The bleeding results from trauma to the head, such as from a fall or motor vehicle accident. There are two types of subdural hemorrhages:  Acute. This type develops shortly after a serious blow to the head and causes blood to collect very quickly. If not diagnosed and treated promptly, severe brain injury or death can occur.  Chronic. This is when bleeding develops more slowly, over weeks or months. RISK FACTORS People at risk for subdural hematoma include older persons, infants, and alcoholics. SYMPTOMS An acute subdural hemorrhage develops over minutes to hours. Symptoms can include:  Temporary loss of consciousness.  Weakness of arms or legs on one side of the body.  Changes in vision or speech.  A severe headache.  Seizures.  Nausea and vomiting.  Increased sleepiness. A chronic subdural hemorrhage develops over weeks to months. Symptoms may develop slowly and produce less noticeable problems or changes. Symptoms include:  A mild headache.  A change in personality.  Loss of balance or difficulty walking.  Weakness, numbness, or tingling in the arms or legs.  Nausea or vomiting.  Memory loss.  Double vision.  Increased sleepiness. DIAGNOSIS Your health care provider will perform a thorough  physical and neurological exam. A CT scan or MRI may also be done. If there is blood on the scan, its color will help your health care provider determine how long the hemorrhage has been there. TREATMENT If the cause is an acute subdural hemorrhage, immediate treatment is needed. In many cases an emergency surgery is performed to drain accumulated blood or to remove the blood clot. Sometimes steroid or diuretic medicines or controlled breathing through a ventilator is needed to decrease pressure in the brain. This is especially true if there is any swelling of the brain. If the cause is a chronic subdural hemorrhage, treatment depends on a variety of factors. Sometimes no treatment is needed. If the subdural hematoma is small and causes minimal or no symptoms, you may be treated with bed rest, medicines, and observation. If the hemorrhage is large or if you have neurological symptoms, an emergency surgery is usually needed to remove the blood clot. People who develop a subdural hemorrhage are at risk of developing seizures, even after the subdural hematoma has been treated. You may be prescribed an anti-seizure (anticonvulsant) medicine for  a year or longer. HOME CARE INSTRUCTIONS  Only take medicines as directed by your health care provider.  Rest if directed by your health care provider.  Keep all follow-up appointments with your health care provider.  If you play a contact sport such as football, hockey or soccer and you experienced a significant head injury, allow enough time for healing (up to 15 days) before you start playing again. A repeated injury that occurs during this fragile repair period is likely to result in hemorrhage. This is called the second impact syndrome. SEEK IMMEDIATE MEDICAL CARE IF:  You fall or experience minor trauma to your head and you are taking blood thinners. If you are on any blood thinners even a very small injury can cause a subdural hematoma. You should not  hesitate to seek medical attention regardless of how minor you think your symptoms are.  You experience a head injury and have:  Drowsiness or a decrease in alertness.  Confusion or forgetfulness.  Slurred speech.  Irrational or aggressive behavior.  Numbness or paralysis in any part of the body.  A feeling of being sick to your stomach (nauseous) or you throw up (vomit).  Difficulty walking or poor coordination.  Double vision.  Seizures.  A bleeding disorder.  A history of heavy alcohol use.  Clear fluid draining from your nose or ears.  Personality changes.  Difficulty thinking.  Worsening symptoms. MAKE SURE YOU:  Understand these instructions.  Will watch your condition.  Will get help right away if you are not doing well or get worse. FOR MORE INFORMATION National Institute of Neurological Disorders and Stroke: www.ninds.nih.gov American Association of Neurological Surgeons: www.neurosurgerytoday.orToledoAutomobile.co.ukg American Academy of Neurology (AAN): ComparePet.czwww.aan.com Brain Injury Association of America: www.biausa.org Document Released: 06/09/2004 Document Revised: 05/13/2013 Document Reviewed: 01/23/2013 [facility] Patient Information 2015 [place], [place]. This information is not intended to replace advice given to you by your health care provider. Make sure you discuss any questions you have with your health care provider.

## 2014-12-19 NOTE — H&P (Signed)
History and Physical  Dorothy Page AOZ:[id number] DOB: 1935/11/01 DOA: 12/19/2014   PCP: Hoyle SauerAVVA,RAVISANKAR R, MD  Referring Physician: ED/ Drs. Wofford and Agricultural engineerGlick  Chief Complaint: falls with cognitive decline  HPI:  79 year old female with a history of diabetes mellitus, stroke, hypertension, hyperlipidemia presented after a mechanical fall on the morning of admission. The patient has a history of dementia with cognitive decline and is unable to provide any significant history. All of this history is obtained from review of the medical record and speaking with the patient's daughter at the bedside. The patient's daughter stated that the patient had a mechanical fall approximately 2 weeks prior to this admission. She was evaluated by EMS at that time and was deemed to be stable without transferring to the hospital. The patient's daughter stated that for 2 days after mechanical fall the patient was in her usual mental status. However, on the third day the patient began making a cognitive and functional decline. Daughter stated that the patient has been more confused since then, and she has had more difficulty with and unsteady gait.  Approximately one week prior to this admission, the patient got locked out of her independent living facility, and the patient could not figure out how to use her daily to get back in.  Apparently, the patient had a mechanical fall on the morning of admission which prompted her to be brought to the emergency department. CT of the brain in the emergency department revealed a subacute subdural hematoma.  There've not been any reports of fevers, chills, chest pain, shortness breath, vomiting, diarrhea, abdominal pain.   In the emergency department, the patient was afebrile and hemodynamically stable. In fact she was hypertensive with blood pressure of 197/104. BMP and CBC were unremarkable. EKG shows sinus rhythm without any ST-T wave changes. Chest x-ray was negative. CT  of the brain showed a left parietal subacute subdural hematoma.  Assessment/Plan: acute encephalopathy -The patient's cognitive and functional decline in the past 1-2 weeks is likely from a subdural hematoma that the patient may have developed after her initial mechanical fall 2 weeks prior to this admission. -Will check TSH, ammonia, RPR, B12 -Urinalysis negative for significant pyuria -Certainly, there may be a component of hypertensive encephalopathy with the patient's uncontrolled blood pressure Subdural hematoma -Neurosurgery, Dr. Jeral FruitBotero was consulted in ED-->no surgical intervention needed -PT/OT evaluation Unsteady gait/mechanical falls -PT/OT evaluation -Check CPK -Check orthostatic vital signs -attempts in the emergency department to get the patient up, but the patient was extremely unsteady Dementia -Continue Namenda -Held all when necessary agitation History of stroke -Hold Plavix in the setting of subdural hematoma Hypertension -Restart Avapro, but suspect the patient will need additional antihypertensive agents Diabetes mellitus type 2 -Discontinue metformin -Hemoglobin A1c -NovoLog sliding scale       Past Medical History  Diagnosis Date  . Diabetes mellitus   . Allergy   . Stroke   . Neuromuscular disorder     mild paralysis  . Hypertension   . Hyperlipidemia     borderline  . Sebaceous cyst   . Dementia    Past Surgical History  Procedure Laterality Date  . Abdominal hysterectomy    . Colonoscopy    . Cystectomy     Social History:  reports that she has never smoked. She does not have any smokeless tobacco history on file. She reports that she does not drink alcohol or use illicit drugs.   Family History  Problem Relation Age  of Onset  . Heart disease Mother   . Diabetes Father   . Diabetes Brother      Allergies  Allergen Reactions  . Aspirin     REACTION: swelling      Prior to Admission medications   Medication Sig Start Date  End Date Taking? Authorizing Provider  clopidogrel (PLAVIX) 75 MG tablet Take 75 mg by mouth daily. 11/20/14  Yes Historical Provider, MD  escitalopram (LEXAPRO) 5 MG tablet Take 5 mg by mouth every morning. 11/20/14  Yes Historical Provider, MD  irbesartan (AVAPRO) 75 MG tablet Take 75 mg by mouth daily. 11/20/14  Yes Historical Provider, MD  metFORMIN (GLUCOPHAGE) 500 MG tablet Take 500 mg by mouth 2 (two) times daily. 11/20/14  Yes Historical Provider, MD  NAMENDA XR 28 MG CP24 24 hr capsule Take 28 mg by mouth daily. 11/20/14  Yes Historical Provider, MD  simvastatin (ZOCOR) 40 MG tablet Take 40 mg by mouth daily. 11/20/14  Yes Historical Provider, MD    Review of Systems:  Unobtainable secondary to patient's mental status  Physical Exam: Filed Vitals:   12/19/14 1100 12/19/14 1500 12/19/14 1613 12/19/14 1709  BP: 231/146 153/82  143/95  Pulse: 92 73  71  Temp:    98.2 F (36.8 C)  TempSrc:    Oral  Resp: Height:    (1.676 m)   Weight:   68 kg (149 lb 14.6 oz)   SpO2: 99% 93%  98%   General:  Awake and alert, NAD, nontoxic, pleasant/cooperative Head/Eye: No conjunctival hemorrhage, no icterus, [place]/AT, No nystagmus ENT:  No icterus,  No thrush, good dentition, no pharyngeal exudate Neck:  No masses, no lymphadenpathy, no bruits; no meningismus CV:  RRR, no rub, no gallop, no S3 Lung:  CTAB, good air movement, no wheeze, no rhonchi Abdomen: soft/NT, +BS, nondistended, no peritoneal signs Ext: No cyanosis, No rashes, No petechiae, No lymphangitis, 1+LE edema Neuro: CNII-XII intact, strength 4/5 in bilateral upper and lower extremities, no dysmetria  Labs on Admission:  Basic Metabolic Panel:  Recent Labs Lab 12/19/14 1115  NA 141  K 4.2  CL 105  CO2 24  GLUCOSE 210*  BUN 9  CREATININE 0.93  CALCIUM 9.3   Liver Function Tests: No results for input(s): AST, ALT, ALKPHOS, BILITOT, PROT, ALBUMIN in the last 168 hours. No results for input(s): LIPASE, AMYLASE  in the last 168 hours. No results for input(s): AMMONIA in the last 168 hours. CBC:  Recent Labs Lab 12/19/14 1115  WBC 8.3  NEUTROABS 4.5  HGB 14.4  HCT 43.0  MCV 88.3  PLT 212   Cardiac Enzymes:  Recent Labs Lab 12/19/14 1115  TROPONINI <0.03   BNP: Invalid input(s): POCBNP CBG:  Recent Labs Lab 12/19/14 1621  GLUCAP 146*    Radiological Exams on Admission: Ct Head Wo Contrast  12/19/2014   CLINICAL DATA:  Fall at home.  Dementia.  EXAM: CT HEAD WITHOUT CONTRAST  CT CERVICAL SPINE WITHOUT CONTRAST  TECHNIQUE: Multidetector CT imaging of the head and cervical spine was performed following the standard protocol without intravenous contrast. Multiplanar CT image reconstructions of the cervical spine were also generated.  COMPARISON:  Cervical spine radiographs 03/04/2008. MRI brain 06/22/2007. CT head 06/21/2007.  FINDINGS: CT HEAD FINDINGS  There is a left parietal subdural hematoma measuring about 16 mm greatest depth. Density is most consistent with subacute phase. There is mild sulcal effacement. 4 mm left-to-right midline shift.  Diffuse  cerebral atrophy. Mild ventricular dilatation consistent with central atrophy. Low-attenuation changes in the deep white matter consistent small vessel ischemic change. Gray-white matter junctions are distinct. Basal cisterns are not effaced. Calvarium appears intact. Mucosal thickening in the paranasal sinuses. Mastoid air cells are not opacified. Vascular calcifications.  CT CERVICAL SPINE FINDINGS  Normal alignment of the cervical spine and facet joints. Diffuse degenerative changes with narrowed interspaces and associated endplate hypertrophic changes. Degenerative changes in the facet joints and at C1 to. No vertebral compression deformities. No prevertebral soft tissue swelling. No focal bone lesion or bone destruction. Bone cortex and trabecular architecture appear intact. Vascular calcifications in the cervical carotid arteries.   IMPRESSION: Left parietal subdural hematoma measuring about 16 mm greatest depth. Density most consistent with subacute phase. 4 mm left-to-right midline shift.  Degenerative changes in the cervical spine. Normal alignment. No acute displaced fractures identified.  These results were called by telephone at the time of interpretation on 12/19/2014 at 6:46 am to Dr. Dione BoozeAVID GLICK , who verbally acknowledged these results.   Electronically Signed   By: Burman NievesWilliam  Stevens M.D.   On: 12/19/2014 06:48   Ct Cervical Spine Wo Contrast  12/19/2014   CLINICAL DATA:  Fall at home.  Dementia.  EXAM: CT HEAD WITHOUT CONTRAST  CT CERVICAL SPINE WITHOUT CONTRAST  TECHNIQUE: Multidetector CT imaging of the head and cervical spine was performed following the standard protocol without intravenous contrast. Multiplanar CT image reconstructions of the cervical spine were also generated.  COMPARISON:  Cervical spine radiographs 03/04/2008. MRI brain 06/22/2007. CT head 06/21/2007.  FINDINGS: CT HEAD FINDINGS  There is a left parietal subdural hematoma measuring about 16 mm greatest depth. Density is most consistent with subacute phase. There is mild sulcal effacement. 4 mm left-to-right midline shift.  Diffuse cerebral atrophy. Mild ventricular dilatation consistent with central atrophy. Low-attenuation changes in the deep white matter consistent small vessel ischemic change. Gray-white matter junctions are distinct. Basal cisterns are not effaced. Calvarium appears intact. Mucosal thickening in the paranasal sinuses. Mastoid air cells are not opacified. Vascular calcifications.  CT CERVICAL SPINE FINDINGS  Normal alignment of the cervical spine and facet joints. Diffuse degenerative changes with narrowed interspaces and associated endplate hypertrophic changes. Degenerative changes in the facet joints and at C1 to. No vertebral compression deformities. No prevertebral soft tissue swelling. No focal bone lesion or bone destruction. Bone  cortex and trabecular architecture appear intact. Vascular calcifications in the cervical carotid arteries.  IMPRESSION: Left parietal subdural hematoma measuring about 16 mm greatest depth. Density most consistent with subacute phase. 4 mm left-to-right midline shift.  Degenerative changes in the cervical spine. Normal alignment. No acute displaced fractures identified.  These results were called by telephone at the time of interpretation on 12/19/2014 at 6:46 am to Dr. Dione BoozeAVID GLICK , who verbally acknowledged these results.   Electronically Signed   By: Burman NievesWilliam  Stevens M.D.   On: 12/19/2014 06:48   Dg Chest Port 1 View  12/19/2014   CLINICAL DATA:  Altered mental status  EXAM: PORTABLE CHEST - 1 VIEW  COMPARISON:  None.  FINDINGS: Normal mediastinum and cardiac silhouette. Normal pulmonary vasculature. No evidence of effusion, infiltrate, or pneumothorax. No acute bony abnormality.  IMPRESSION: No acute cardiopulmonary process.   Electronically Signed   By: Genevive BiStewart  Edmunds M.D.   On: 12/19/2014 11:15    EKG: Independently reviewed. Sinus rhythm, no ST-T wave change    Time spent:60 minutes Code Status:   FULL Family Communication:  Daughter updated at bedside   Vernetta Dizdarevic, DO  [facility] Pager [phone]  If 7PM-7AM, please contact night-coverage [email or web address] Password [facility] 12/19/2014, 5:48 PM

## 2014-12-19 NOTE — ED Notes (Signed)
Report given to 4N RN,

## 2014-12-19 NOTE — ED Notes (Signed)
Attempted to walk pt-- two max assist-- pt needed assistance sitting up, standing up-- unsteady on feet, grabbing at staff members, needs to be held when standing. This nurse does not feel that pt is able to walk around the room unsupervised, Dr Loretha StaplerWofford notified.

## 2014-12-19 NOTE — ED Notes (Signed)
Pt is not able to follow simple directions, when asked to pick up hips for bedpan, pt will lift legs in the air, when told to reach for siderail to help roll over, pt reaches up in the air.  Pt wandered outside last week at independent home and got locked out-- was seen by staff. Staff tried to teach her how to ring the doorbell, pt was unable to return demonstration.

## 2014-12-19 NOTE — ED Notes (Signed)
Patient transported to CT 

## 2014-12-19 NOTE — ED Notes (Signed)
Pt brought to ED by [facility] after having a fall, pt was found on another resident apartment on the floor, incontinent on urine and BM, pt AO to her self only with hx of dementia, per EMS her apartment is all around on feces and urine, pt will benefit for SW consult for placement. Pt c/o of some pain on right arm.

## 2014-12-19 NOTE — ED Notes (Signed)
Spoke with Augusto GambleJody, Child psychotherapistsocial worker on call-- will see pt.

## 2014-12-19 NOTE — ED Notes (Signed)
Asked dr Loretha Staplerwofford if pt can take AM blood pressure meds

## 2014-12-19 NOTE — ED Provider Notes (Signed)
Care assumed from Dr. Preston FleetingGlick.  Pt's daughter reports that she has had more of a cognitive decline since yesterday.  In the ED, she has had a hard time following commands and she is unable to ambulate safely.  For these reasons in the setting of SDH, she needs admission and further evaluation.    Clinical Impression: 1. Subacute subdural hematoma   2. Fall at home, initial encounter   3. Altered mental status       Dorothy DivineJohn Zayneb Baucum, MD 12/19/14 [id number]

## 2014-12-19 NOTE — ED Provider Notes (Addendum)
CSN: [id number]     Arrival date & time 12/19/14  [id number] History   First MD Initiated Contact with Patient 12/19/14 0543     Chief Complaint  Patient presents with  . Fall     (Consider location/radiation/quality/duration/timing/severity/associated sxs/prior Treatment) Patient is a 79 y.o. female presenting with fall. The history is provided by the patient and the nursing home. The history is limited by the condition of the patient (Dementia).  Fall  She lives in an assisted living facility and was found at another residence apartment on the floor and had been incontinent of urine. Patient states that she thinks she just lost her balance and fell but is not sure. She denies any injury or pain.  Past Medical History  Diagnosis Date  . Diabetes mellitus   . Allergy   . Stroke   . Neuromuscular disorder     mild paralysis  . Hypertension   . Hyperlipidemia     borderline  . Sebaceous cyst    Past Surgical History  Procedure Laterality Date  . Abdominal hysterectomy    . Colonoscopy    . Cystectomy     Family History  Problem Relation Age of Onset  . Heart disease Mother   . Diabetes Father   . Diabetes Brother    History  Substance Use Topics  . Smoking status: Never Smoker   . Smokeless tobacco: Not on file  . Alcohol Use: No   OB History    No data available     Review of Systems  Unable to perform ROS: Dementia      Allergies  Aspirin  Home Medications   Prior to Admission medications   Medication Sig Start Date End Date Taking? Authorizing Provider  Clopidogrel Bisulfate (PLAVIX PO) Take by mouth daily.      Historical Provider, MD  Donepezil HCl (ARICEPT PO) Take by mouth daily.      Historical Provider, MD  metFORMIN (GLUCOPHAGE) 1000 MG tablet Take 1,000 mg by mouth 2 (two) times daily with a meal.      Historical Provider, MD  Simvastatin (ZOCOR PO) Take by mouth daily.      Historical Provider, MD   BP 190/87 mmHg  Pulse 71  Temp(Src) 97.8 F  (36.6 C) (Oral)  Resp 18  SpO2 98% Physical Exam  Nursing note and vitals reviewed.  79 year old female, resting comfortably and in no acute distress. Vital signs are significant for hypertension. Oxygen saturation is 98%, which is normal. Head is normocephalic and atraumatic. PERRLA, EOMI. Oropharynx is clear. Neck is nontender without adenopathy or JVD. Back is nontender and there is no CVA tenderness. Lungs are clear without rales, wheezes, or rhonchi. Chest is nontender. Heart has regular rate and rhythm without murmur. Abdomen is soft, flat, nontender without masses or hepatosplenomegaly and peristalsis is normoactive. Extremities have no cyanosis or edema, full range of motion is present. Skin is warm and dry without rash. Neurologic: She is oriented to person and place but not time, speech is normal, cranial nerves are intact, there are no motor or sensory deficits.  ED Course  Procedures (including critical care time)  Imaging Review Ct Head Wo Contrast  12/19/2014   CLINICAL DATA:  Fall at home.  Dementia.  EXAM: CT HEAD WITHOUT CONTRAST  CT CERVICAL SPINE WITHOUT CONTRAST  TECHNIQUE: Multidetector CT imaging of the head and cervical spine was performed following the standard protocol without intravenous contrast. Multiplanar CT image reconstructions of the cervical spine  were also generated.  COMPARISON:  Cervical spine radiographs 03/04/2008. MRI brain 06/22/2007. CT head 06/21/2007.  FINDINGS: CT HEAD FINDINGS  There is a left parietal subdural hematoma measuring about 16 mm greatest depth. Density is most consistent with subacute phase. There is mild sulcal effacement. 4 mm left-to-right midline shift.  Diffuse cerebral atrophy. Mild ventricular dilatation consistent with central atrophy. Low-attenuation changes in the deep white matter consistent small vessel ischemic change. Gray-white matter junctions are distinct. Basal cisterns are not effaced. Calvarium appears intact.  Mucosal thickening in the paranasal sinuses. Mastoid air cells are not opacified. Vascular calcifications.  CT CERVICAL SPINE FINDINGS  Normal alignment of the cervical spine and facet joints. Diffuse degenerative changes with narrowed interspaces and associated endplate hypertrophic changes. Degenerative changes in the facet joints and at C1 to. No vertebral compression deformities. No prevertebral soft tissue swelling. No focal bone lesion or bone destruction. Bone cortex and trabecular architecture appear intact. Vascular calcifications in the cervical carotid arteries.  IMPRESSION: Left parietal subdural hematoma measuring about 16 mm greatest depth. Density most consistent with subacute phase. 4 mm left-to-right midline shift.  Degenerative changes in the cervical spine. Normal alignment. No acute displaced fractures identified.  These results were called by telephone at the time of interpretation on 12/19/2014 at 6:46 am to Dr. Dione Booze , who verbally acknowledged these results.   Electronically Signed   By: Burman Nieves M.D.   On: 12/19/2014 06:48   Ct Cervical Spine Wo Contrast  12/19/2014   CLINICAL DATA:  Fall at home.  Dementia.  EXAM: CT HEAD WITHOUT CONTRAST  CT CERVICAL SPINE WITHOUT CONTRAST  TECHNIQUE: Multidetector CT imaging of the head and cervical spine was performed following the standard protocol without intravenous contrast. Multiplanar CT image reconstructions of the cervical spine were also generated.  COMPARISON:  Cervical spine radiographs 03/04/2008. MRI brain 06/22/2007. CT head 06/21/2007.  FINDINGS: CT HEAD FINDINGS  There is a left parietal subdural hematoma measuring about 16 mm greatest depth. Density is most consistent with subacute phase. There is mild sulcal effacement. 4 mm left-to-right midline shift.  Diffuse cerebral atrophy. Mild ventricular dilatation consistent with central atrophy. Low-attenuation changes in the deep white matter consistent small vessel ischemic  change. Gray-white matter junctions are distinct. Basal cisterns are not effaced. Calvarium appears intact. Mucosal thickening in the paranasal sinuses. Mastoid air cells are not opacified. Vascular calcifications.  CT CERVICAL SPINE FINDINGS  Normal alignment of the cervical spine and facet joints. Diffuse degenerative changes with narrowed interspaces and associated endplate hypertrophic changes. Degenerative changes in the facet joints and at C1 to. No vertebral compression deformities. No prevertebral soft tissue swelling. No focal bone lesion or bone destruction. Bone cortex and trabecular architecture appear intact. Vascular calcifications in the cervical carotid arteries.  IMPRESSION: Left parietal subdural hematoma measuring about 16 mm greatest depth. Density most consistent with subacute phase. 4 mm left-to-right midline shift.  Degenerative changes in the cervical spine. Normal alignment. No acute displaced fractures identified.  These results were called by telephone at the time of interpretation on 12/19/2014 at 6:46 am to Dr. Dione Booze , who verbally acknowledged these results.   Electronically Signed   By: Burman Nieves M.D.   On: 12/19/2014 06:48   Images viewed by me, discussed with radiologist.  MDM   Final diagnoses:  Fall at home, initial encounter  Subacute subdural hematoma    Fall without evidence of injury. She is sent for CT of head and cervical spine.  CT shows subacute subdural hematoma with slight midline shift. Case has been discussed with Dr. Jeral FruitBotero of neurosurgery has reviewed the CT scan and states that the patient can go home, and will need follow-up CT scan and 10-14 days. She is to contact his office to set that up.   Dione Boozeavid Gino Garrabrant, MD 12/19/14 [phone]  Patient's daughter has arrived and has confirmed that the patient actually did have a fall about 10 days ago where she did hit her head. Daughter is concerned that she needs a higher level of care and is  requesting social service consult. This has been ordered. Daughter has stated that she has already spoken with an assisted-living facility that does have some day's and she anticipates being able to move her there today.  Dione Boozeavid Rashidah [place], MD 12/19/14 [phone]

## 2014-12-19 NOTE — Progress Notes (Signed)
CSW Student Intern, Dorothy HoveJeneya Page, spoke with pt's daughter today re: ALF [facility].  [facility].  Per facility, they do not have anyone working in admissions on the weekend and they do not take new residents on the weekends.  RN informed.  Would recommend placement from the community, as we do not typically place new ALF pt's from the ED setting.  Pt may qualify for HHC (? PT/OT) and if so, HH SW could be ordered to assist pt/daughter with placement from home.

## 2014-12-19 NOTE — ED Notes (Signed)
Spoke with daughter at length regarding pt's level of care-- currently in independent living, unable to care for self in current living situation. Will talk with social worker regarding placement.

## 2014-12-20 LAB — GLUCOSE, CAPILLARY
GLUCOSE-CAPILLARY: 186 mg/dL — AB (ref 65–99)
GLUCOSE-CAPILLARY: 159 mg/dL — AB (ref 65–99)
Glucose-Capillary: 153 mg/dL — ABNORMAL HIGH (ref 65–99)
Glucose-Capillary: 167 mg/dL — ABNORMAL HIGH (ref 65–99)

## 2014-12-20 LAB — HEMOGLOBIN A1C
HEMOGLOBIN A1C: 8.4 % — AB (ref 4.8–5.6)
Mean Plasma Glucose: 194 mg/dL

## 2014-12-20 LAB — COMPREHENSIVE METABOLIC PANEL
ALBUMIN: 3.2 g/dL — AB (ref 3.5–5.0)
ALT: 12 U/L — ABNORMAL LOW (ref 14–54)
AST: 17 U/L (ref 15–41)
Alkaline Phosphatase: 101 U/L (ref 38–126)
Anion gap: 7 (ref 5–15)
BUN: 9 mg/dL (ref 6–20)
CALCIUM: 8.9 mg/dL (ref 8.9–10.3)
CO2: 28 mmol/L (ref 22–32)
Chloride: 104 mmol/L (ref 101–111)
Creatinine, Ser: 0.9 mg/dL (ref 0.44–1.00)
GFR calc Af Amer: >60 mL/min (ref >60)
Glucose, Bld: 187 mg/dL — ABNORMAL HIGH (ref 65–99)
Potassium: 4 mmol/L (ref 3.5–5.1)
Sodium: 139 mmol/L (ref 135–145)
Total Bilirubin: 1.4 mg/dL — ABNORMAL HIGH (ref 0.3–1.2)
Total Protein: 6.4 g/dL — ABNORMAL LOW (ref 6.5–8.1)

## 2014-12-20 LAB — CBC
HCT: 43.5 % (ref 36.0–46.0)
HEMOGLOBIN: 14.2 g/dL (ref 12.0–15.0)
MCH: 29.2 pg (ref 26.0–34.0)
MCHC: 32.6 g/dL (ref 30.0–36.0)
MCV: 89.3 fL (ref 78.0–100.0)
Platelets: 213 10*3/uL (ref 150–400)
RBC: 4.87 MIL/uL (ref 3.87–5.11)
RDW: 13.2 % (ref 11.5–15.5)
WBC: 7.4 10*3/uL (ref 4.0–10.5)

## 2014-12-20 LAB — HIV ANTIBODY (ROUTINE TESTING W REFLEX): HIV Screen 4th Generation wRfx: NONREACTIVE

## 2014-12-20 LAB — RPR: RPR Ser Ql: NONREACTIVE

## 2014-12-20 LAB — FOLATE RBC
FOLATE, RBC: 825 ng/mL (ref >498)
Folate, Hemolysate: 328.2 ng/mL
Hematocrit: 39.8 % (ref 34.0–46.6)

## 2014-12-20 MED ORDER — HYDRALAZINE HCL 20 MG/ML IJ SOLN: 10.0000 mg | Freq: Three times a day (TID) | INTRAMUSCULAR | Status: DC | PRN

## 2014-12-20 MED ORDER — AMLODIPINE BESYLATE 2.5 MG PO TABS
2.5000 mg | ORAL_TABLET | Freq: Every day | ORAL | Status: DC
  Administered 2014-12-20 – 2014-12-21 (×2): 2.5 mg via ORAL
  Filled 2014-12-20 (×2): qty 1

## 2014-12-20 MED ORDER — CYANOCOBALAMIN 1000 MCG/ML IJ SOLN
100.0000 ug | Freq: Every day | INTRAMUSCULAR | Status: DC
  Administered 2014-12-20: 100 ug via INTRAMUSCULAR
  Filled 2014-12-20 (×2): qty 0.1

## 2014-12-20 NOTE — Clinical Social Work Note (Addendum)
CSW Consult Acknowledged:   CSW received a consult for SNF placement. CSW awaiting PT/OT evaluation to determine the appropriate level of care.      Addendum:  [facility] is [facility]. She can return if the medically team deeds it's appropriate.     Arpan Eskelson, MSW, LCSWA [phone]

## 2014-12-20 NOTE — Evaluation (Signed)
Clinical/Bedside Swallow Evaluation Patient Details  Name: Clance BollKathleen H Jaworski MRN: [id number] Date of Birth: 09-Mar-1936  Today's Date: 12/20/2014 Time: SLP Start Time (ACUTE ONLY): 1230 SLP Stop Time (ACUTE ONLY): 1245 SLP Time Calculation (min) (ACUTE ONLY): 15 min  Past Medical History:  Past Medical History  Diagnosis Date  . Diabetes mellitus   . Allergy   . Stroke   . Neuromuscular disorder     mild paralysis  . Hypertension   . Hyperlipidemia     borderline  . Sebaceous cyst   . Dementia    Past Surgical History:  Past Surgical History  Procedure Laterality Date  . Abdominal hysterectomy    . Colonoscopy    . Cystectomy     HPI:  79 year old female admitted s/p fall with subsequent cognitive decline followed by an additional fall on the morning of admission. CT of the brain showed a left parietal subacute subdural hematoma. PMH of dementia, CVA, HTN, DM2.    Assessment / Plan / Recommendation Clinical Impression  Patient presents with a normal oropharyngeal swallow. No overt indication of aspiration observed. No SLP f/u indicated for swallow however would benefit from cognitive linguistic evaluation given new BI. MD, please order if agree.     Aspiration Risk  Mild    Diet Recommendation Thin (regular solids)   Medication Administration: Whole meds with liquid Compensations: Slow rate;Small sips/bites    Other  Recommendations Oral Care Recommendations: Oral care BID         Pertinent Vitals/Pain n/a        Swallow Study    General Other Pertinent Information: 79 year old female admitted s/p fall with subsequent cognitive decline followed by an additional fall on the morning of admission. CT of the brain showed a left parietal subacute subdural hematoma. PMH of dementia, CVA, HTN, DM2.  Type of Study: Bedside swallow evaluation Diet Prior to this Study: Regular;Thin liquids Temperature Spikes Noted: No Respiratory Status: Room air History of Recent  Intubation: No Behavior/Cognition: Alert;Cooperative;Confused;Pleasant mood;Requires cueing Oral Cavity - Dentition: Adequate natural dentition/normal for age Self-Feeding Abilities: Able to feed self;Needs assist Patient Positioning: Upright in bed Baseline Vocal Quality: Normal Volitional Cough: Strong Volitional Swallow: Able to elicit    Oral/Motor/Sensory Function Overall Oral Motor/Sensory Function: Appears within functional limits for tasks assessed   Ice Chips Ice chips: Not tested   Thin Liquid Thin Liquid: Within functional limits Presentation: Self Fed;Straw    Nectar Thick Nectar Thick Liquid: Not tested   Honey Thick Honey Thick Liquid: Not tested   Puree Puree: Not tested   Solid   GO    Solid: Within functional limits Presentation: Self Fed      Janeisha Ryle MA, CCC-SLP [phone]  [facility] 12/20/2014,12:54 PM

## 2014-12-20 NOTE — Clinical Social Work Placement (Signed)
   CLINICAL SOCIAL WORK PLACEMENT  NOTE  Date:  12/20/2014  Patient Details  Name: Dorothy Page MRN: [id number] Date of Birth: 11/27/1935  Clinical Social Work is seeking post-discharge placement for this patient at the Skilled  Nursing Facility level of care (*CSW will initial, date and re-position this form in  chart as items are completed):  Yes   Patient/family provided with [facility] Clinical Social Work Department's list of facilities offering this level of care within the geographic area requested by the patient (or if unable, by the patient's family).  Yes   Patient/family informed of their freedom to choose among providers that offer the needed level of care, that participate in Medicare, Medicaid or managed care program needed by the patient, have an available bed and are willing to accept the patient.  Yes   Patient/family informed of 's ownership interest [facility] and [facility], as well as of the fact that they are under no obligation to receive care at these facilities.  PASRR submitted to EDS on       PASRR number received on       Existing PASRR number confirmed on 12/20/14     FL2 transmitted to all facilities in geographic area requested by pt/family on       FL2 transmitted to all facilities within larger geographic area on       Patient informed that his/her managed care company has contracts with or will negotiate with certain facilities, including the following:            Patient/family informed of bed offers received.  Patient chooses bed at       Physician recommends and patient chooses bed at      Patient to be transferred to   on  .  Patient to be transferred to facility by       Patient family notified on   of transfer.  Name of family member notified:        PHYSICIAN       Additional Comment:    _______________________________________________ Gwynne EdingerBibbs, Skylen Spiering, LCSW 12/20/2014, 12:23 PM

## 2014-12-20 NOTE — Evaluation (Signed)
Physical Therapy Evaluation Patient Details Name: Dorothy Page MRN: [id number] DOB: [birth year] Today's Date: 12/20/2014   History of Present Illness  pt presents after multiple falls over past week and was found to have SDH.  pt with hx of Dementia.    Clinical Impression  Pt with hx of Dementia, but unclear if this is pt's baseline level.  Pt was very wet with urine on arrival with pt unaware of incontinence.  Pt needed total A for peri-hygiene and frequent hand over hand cues for positioning during hygiene.  Pt needs A for all aspects of mobility and decreased cognition limits pt's ability to A with mobility.  Feel at this time pt is not safe to return to ALF and will need SNF level of care at D/C.      Follow Up Recommendations SNF    Equipment Recommendations  None recommended by PT    Recommendations for Other Services       Precautions / Restrictions Precautions Precautions: Fall Restrictions Weight Bearing Restrictions: No      Mobility  Bed Mobility Overal bed mobility: Needs Assistance Bed Mobility: Rolling;Sidelying to Sit;Sit to Supine Rolling: Mod assist Sidelying to sit: Mod assist   Sit to supine: Mod assist   General bed mobility comments: cues for sequencing and staying on task.  pt does attempt to A with mobility, but difficulty staying on task with cognitive deficits and often needed hand over hand A.    Transfers Overall transfer level: Needs assistance Equipment used: 1 person hand held assist Transfers: Sit to/from Stand Sit to Stand: Mod assist         General transfer comment: cues for UE use and attending to task with coming to stand.  pt easily distracted by gown and heart monitor wires.    Ambulation/Gait                Stairs            Wheelchair Mobility    Modified Rankin (Stroke Patients Only)       Balance Overall balance assessment: Needs assistance;History of Falls Sitting-balance support: No upper  extremity supported;Feet supported Sitting balance-Leahy Scale: Fair     Standing balance support: No upper extremity supported;During functional activity Standing balance-Leahy Scale: Poor                               Pertinent Vitals/Pain Pain Assessment: No/denies pain    Home Living Family/patient expects to be discharged to:: Skilled nursing facility                      Prior Function Level of Independence: Needs assistance   Gait / Transfers Assistance Needed: Unclear level of mobility, but per chart was ambulatory at ALF.    ADL's / Homemaking Assistance Needed: pt resides at ALF and was recieving A for homemaking.          Hand Dominance        Extremity/Trunk Assessment   Upper Extremity Assessment: Generalized weakness           Lower Extremity Assessment: Generalized weakness      Cervical / Trunk Assessment: Kyphotic  Communication   Communication: No difficulties  Cognition Arousal/Alertness: Awake/alert Behavior During Therapy: WFL for tasks assessed/performed Overall Cognitive Status: No family/caregiver present to determine baseline cognitive functioning (pt with hx of Dementia, but unclear if this is baseline.)  General Comments      Exercises        Assessment/Plan    PT Assessment Patient needs continued PT services  PT Diagnosis Difficulty walking;Generalized weakness   PT Problem List Decreased strength;Decreased activity tolerance;Decreased balance;Decreased mobility;Decreased coordination;Decreased cognition;Decreased knowledge of use of DME;Decreased safety awareness  PT Treatment Interventions DME instruction;Gait training;Functional mobility training;Therapeutic activities;Therapeutic exercise;Balance training;Cognitive remediation;Patient/family education   PT Goals (Current goals can be found in the Care Plan section) Acute Rehab PT Goals Patient Stated Goal: pt unable to  state. PT Goal Formulation: With patient Time For Goal Achievement: 01/03/15 Potential to Achieve Goals: Good    Frequency Min 2X/week   Barriers to discharge        Co-evaluation               End of Session Equipment Utilized During Treatment: Gait belt Activity Tolerance: Patient limited by fatigue Patient left: in bed;with call bell/phone within reach;with bed alarm set Nurse Communication: Mobility status         Time: [id number] PT Time Calculation (min) (ACUTE ONLY): 28 min   Charges:   PT Evaluation $Initial PT Evaluation Tier I: 1 Procedure PT Treatments $Therapeutic Activity: 8-22 mins   PT G CodesSunny Schlein:        Lachell Rochette F, [place]PT [phone] 12/20/2014, 1:30 PM

## 2014-12-20 NOTE — Clinical Social Work Note (Signed)
Clinical Social Work Assessment  Patient Details  Name: Dorothy Page MRN: [id number] Date of Birth: [birth year]  Date of referral:  12/20/14               Reason for consult:  Facility Placement              Housing/Transportation Living arrangements for the past 2 months:  [place] of Information:  Adult Children Patient Interpreter Needed:  None Criminal Activity/Legal Involvement Pertinent to Current Situation/Hospitalization:  No - Comment as needed Significant Relationships:  Adult Children Lives with:  Self Do you feel safe going back to the place where you live?  No Need for family participation in patient care:  Yes (Comment)  Care giving concerns:  None    Social Worker assessment / plan:  CSW met with the pt's daughter Hilda Blades. CSW introduced self and purpose of the visit. CSW discussed SNF rehab. CSW explained the SNF process. CSW explained insurance and its relation to SNF placement. CSW discussed geographic location in which Hilda Blades would like the pt receive rehab. Hilda Blades reported that she would like [facility] and [facility]. CSW answered all questions in which the pt inquired about. CSW will continue to follow this pt and assist with discharge as needed.    Insurance information:  Managed Care Personnel officer ([employer])) PT Recommendations:  [place] / Referral to community resources:  [place]  Patient/Family's Response to care:  Hilda Blades reported the care in which the pt is receiving is great.    Patient/Family's Understanding of and Emotional Response to Diagnosis, Current Treatment, and Prognosis:  Hilda Blades explained the pt's prior level of function before she was admitted. Hilda Blades acknowledged that due to the 3 falls the pt endured that the pt is no long safe to return back to [facility]. Hilda Blades is open to SNF placement.   Emotional Assessment Orientation:  Oriented to Self Alcohol /  Substance use:  Not Applicable Psych involvement (Current and /or in the community):  No (Comment)  Discharge Needs  Concerns to be addressed:  Denies Needs/Concerns at this time Current discharge risk:  Lives alone Barriers to Discharge:  No Barriers Identified  [place], MSW, [place]

## 2014-12-20 NOTE — Progress Notes (Signed)
[facility] PROGRESS NOTE  Dorothy Page UJW:[id number] DOB: Jul 29, 1936 DOA: 12/19/2014 PCP: Hoyle Sauer, MD  Assessment/Plan: Acute encephalopathy -The patient's cognitive and functional decline in the past 1-2 weeks is likely from a subdural hematoma that the patient may have developed after her initial mechanical fall 2 weeks prior to this admission. Also B 12 deficiency might play a role on functional decline.  -TSH 3.09 , ammonia 21 , RPR non reactive, B-12 low, start supplement.  -Urinalysis negative for significant pyuria  Subdural hematoma; Sub-acute -Neurosurgery, Dr. Jeral Fruit was consulted in ED-->no surgical intervention needed -PT/OT evaluation -Patient needs CT head in 10 days.   B-12 deficiency; will start supplement 100 mcg IM for 7 days.   Unsteady gait/mechanical falls -PT/OT evaluation -Check CPK -Might be related to B 12 deficiency.   Dementia -Continue Namenda -Held all when necessary agitation  History of stroke -Hold Plavix in the setting of subdural hematoma  Hypertension -Continue with avapro. Will also start low dose Norvasc. PRN hydralazine.   Diabetes mellitus type 2 -Discontinue metformin -Hemoglobin A1c 8.4, need better Blood sugar controlled.  -NovoLog sliding scale  Code Status: Full Code.  Family Communication:  Disposition Plan: needs SNF in 24 hours.    Consultants:  Dr Jeral Fruit, phone  Procedures:  none  Antibiotics:  none  HPI/Subjective: Alert in no distress.  pleasantly confuse.   Objective: Filed Vitals:   12/20/14 1410  BP: 178/89  Pulse: 70  Temp: 98.3 F (36.8 C)  Resp: 20    Intake/Output Summary (Last 24 hours) at 12/20/14 1659 Last data filed at 12/19/14 2157  Gross per 24 hour  Intake      3 ml  Output      0 ml  Net      3 ml   Filed Weights   12/19/14 0551 12/19/14 1613  Weight: 75.751 kg (167 lb) 68 kg (149 lb 14.6 oz)    Exam:   General:  Alert in no distress.    Cardiovascular: S 1, S 2 RRR  Respiratory: CTA  Abdomen: BS present, soft, nt  Musculoskeletal: no edema.   Neuro; non focal.   Data Reviewed: Basic Metabolic Panel:  Recent Labs Lab 12/19/14 1115 12/20/14 0354  NA 141 139  K 4.2 4.0  CL 105 104  CO2 24 28  GLUCOSE 210* 187*  BUN 9 9  CREATININE 0.93 0.90  CALCIUM 9.3 8.9   Liver Function Tests:  Recent Labs Lab 12/20/14 0354  AST 17  ALT 12*  ALKPHOS 101  BILITOT 1.4*  PROT 6.4*  ALBUMIN 3.2*   No results for input(s): LIPASE, AMYLASE in the last 168 hours.  Recent Labs Lab 12/19/14 1805  AMMONIA 21   CBC:  Recent Labs Lab 12/19/14 1115 12/19/14 1805 12/20/14 0354  WBC 8.3  --  7.4  NEUTROABS 4.5  --   --   HGB 14.4  --  14.2  HCT 43.0 39.8 43.5  MCV 88.3  --  89.3  PLT 212  --  213   Cardiac Enzymes:  Recent Labs Lab 12/19/14 1115 12/19/14 1805  CKTOTAL  --  219  TROPONINI <0.03  --    BNP (last 3 results) No results for input(s): BNP in the last 8760 hours.  ProBNP (last 3 results) No results for input(s): PROBNP in the last 8760 hours.  CBG:  Recent Labs Lab 12/19/14 1621 12/19/14 2235 12/20/14 0641 12/20/14 1137  GLUCAP 146* 171* 186* 159*    No  results found for this or any previous visit (from the past 240 hour(s)).   Studies: Ct Head Wo Contrast  12/19/2014   CLINICAL DATA:  Fall at home.  Dementia.  EXAM: CT HEAD WITHOUT CONTRAST  CT CERVICAL SPINE WITHOUT CONTRAST  TECHNIQUE: Multidetector CT imaging of the head and cervical spine was performed following the standard protocol without intravenous contrast. Multiplanar CT image reconstructions of the cervical spine were also generated.  COMPARISON:  Cervical spine radiographs 03/04/2008. MRI brain 06/22/2007. CT head 06/21/2007.  FINDINGS: CT HEAD FINDINGS  There is a left parietal subdural hematoma measuring about 16 mm greatest depth. Density is most consistent with subacute phase. There is mild sulcal effacement.  4 mm left-to-right midline shift.  Diffuse cerebral atrophy. Mild ventricular dilatation consistent with central atrophy. Low-attenuation changes in the deep white matter consistent small vessel ischemic change. Gray-white matter junctions are distinct. Basal cisterns are not effaced. Calvarium appears intact. Mucosal thickening in the paranasal sinuses. Mastoid air cells are not opacified. Vascular calcifications.  CT CERVICAL SPINE FINDINGS  Normal alignment of the cervical spine and facet joints. Diffuse degenerative changes with narrowed interspaces and associated endplate hypertrophic changes. Degenerative changes in the facet joints and at C1 to. No vertebral compression deformities. No prevertebral soft tissue swelling. No focal bone lesion or bone destruction. Bone cortex and trabecular architecture appear intact. Vascular calcifications in the cervical carotid arteries.  IMPRESSION: Left parietal subdural hematoma measuring about 16 mm greatest depth. Density most consistent with subacute phase. 4 mm left-to-right midline shift.  Degenerative changes in the cervical spine. Normal alignment. No acute displaced fractures identified.  These results were called by telephone at the time of interpretation on 12/19/2014 at 6:46 am to Dr. Dione BoozeAVID GLICK , who verbally acknowledged these results.   Electronically Signed   By: Burman NievesWilliam  Stevens M.D.   On: 12/19/2014 06:48   Ct Cervical Spine Wo Contrast  12/19/2014   CLINICAL DATA:  Fall at home.  Dementia.  EXAM: CT HEAD WITHOUT CONTRAST  CT CERVICAL SPINE WITHOUT CONTRAST  TECHNIQUE: Multidetector CT imaging of the head and cervical spine was performed following the standard protocol without intravenous contrast. Multiplanar CT image reconstructions of the cervical spine were also generated.  COMPARISON:  Cervical spine radiographs 03/04/2008. MRI brain 06/22/2007. CT head 06/21/2007.  FINDINGS: CT HEAD FINDINGS  There is a left parietal subdural hematoma measuring  about 16 mm greatest depth. Density is most consistent with subacute phase. There is mild sulcal effacement. 4 mm left-to-right midline shift.  Diffuse cerebral atrophy. Mild ventricular dilatation consistent with central atrophy. Low-attenuation changes in the deep white matter consistent small vessel ischemic change. Gray-white matter junctions are distinct. Basal cisterns are not effaced. Calvarium appears intact. Mucosal thickening in the paranasal sinuses. Mastoid air cells are not opacified. Vascular calcifications.  CT CERVICAL SPINE FINDINGS  Normal alignment of the cervical spine and facet joints. Diffuse degenerative changes with narrowed interspaces and associated endplate hypertrophic changes. Degenerative changes in the facet joints and at C1 to. No vertebral compression deformities. No prevertebral soft tissue swelling. No focal bone lesion or bone destruction. Bone cortex and trabecular architecture appear intact. Vascular calcifications in the cervical carotid arteries.  IMPRESSION: Left parietal subdural hematoma measuring about 16 mm greatest depth. Density most consistent with subacute phase. 4 mm left-to-right midline shift.  Degenerative changes in the cervical spine. Normal alignment. No acute displaced fractures identified.  These results were called by telephone at the time of interpretation on 12/19/2014  at 6:46 am to Dr. Dione BoozeAVID GLICK , who verbally acknowledged these results.   Electronically Signed   By: Burman NievesWilliam  Stevens M.D.   On: 12/19/2014 06:48   Dg Chest Port 1 View  12/19/2014   CLINICAL DATA:  Altered mental status  EXAM: PORTABLE CHEST - 1 VIEW  COMPARISON:  None.  FINDINGS: Normal mediastinum and cardiac silhouette. Normal pulmonary vasculature. No evidence of effusion, infiltrate, or pneumothorax. No acute bony abnormality.  IMPRESSION: No acute cardiopulmonary process.   Electronically Signed   By: Genevive BiStewart  Edmunds M.D.   On: 12/19/2014 11:15    Scheduled Meds: . amLODipine   2.5 mg Oral Daily  . cyanocobalamin  100 mcg Intramuscular Daily  . escitalopram  5 mg Oral [id number]  . insulin aspart  0-9 Units Subcutaneous TID WC  . irbesartan  75 mg Oral Daily  . memantine  28 mg Oral Daily  . sodium chloride  3 mL Intravenous Q12H   Continuous Infusions:   Active Problems:   Subdural hematoma   Acute encephalopathy   Type 2 diabetes mellitus with hyperglycemia   Essential hypertension   Subacute subdural hematoma    Time spent: 35 minutes.     Hartley Barefootegalado, Daeton Kluth A  [facility] Pager [phone]. If 7PM-7AM, please contact night-coverage at [email or web address], password [email or web address] 12/20/2014, 4:59 PM  LOS: 1 day

## 2014-12-21 LAB — GLUCOSE, CAPILLARY
Glucose-Capillary: 207 mg/dL — ABNORMAL HIGH (ref 65–99)
Glucose-Capillary: 181 mg/dL — ABNORMAL HIGH (ref 65–99)

## 2014-12-21 MED ORDER — CYANOCOBALAMIN 1000 MCG/ML IJ SOLN: 100.0000 ug | Freq: Every day | INTRAMUSCULAR | Status: AC

## 2014-12-21 MED ORDER — AMLODIPINE BESYLATE 2.5 MG PO TABS: 2.5000 mg | ORAL_TABLET | Freq: Every day | ORAL | Status: AC

## 2014-12-21 NOTE — Evaluation (Signed)
Occupational Therapy Evaluation Patient Details Name: Dorothy Page MRN: [id number] DOB: Jan 08, 1936 Today's Date: 12/21/2014    History of Present Illness pt presents after multiple falls over past week and was found to have SDH.  pt with hx of Dementia.     Clinical Impression   Pt admitted with the above diagnoses and presents with below problem list. Pt will benefit from continued acute OT to address the below listed deficits and maximize independence with BADLs prior to d/c to venue below. Unsure of PLOF for ADLs though  pt likely mod I. Pt currently needng mod A for UB ADLs and mod A +2 for safety with LB ADLs. Pt c/o RUE and RLE pain as detailed below. OT to continue to follow acutely.    Follow Up Recommendations  SNF    Equipment Recommendations  Other (comment) (TBD next venue)    Recommendations for Other Services       Precautions / Restrictions Precautions Precautions: Fall Restrictions Weight Bearing Restrictions: No      Mobility Bed Mobility Overal bed mobility: Needs Assistance Bed Mobility: Rolling;Sit to Sidelying;Supine to Sit Rolling: Min guard (rolling onto back)   Supine to sit: HOB elevated;Min assist   Sit to sidelying: Mod assist General bed mobility comments: Pt initiated coming to EOB upon therapist asking. Some difficulty noted with sequencing task with additional time needed for figuring out hand placement. Pt ultimately used bed rail and mattress to come to EOB at close min guard level.   Transfers Overall transfer level: Needs assistance Equipment used: 1 person hand held assist Transfers: Sit to/from Stand Sit to Stand: Mod assist         General transfer comment: cues and extra time for sequencing and attention to task. mod A for powerup phase.    Balance Overall balance assessment: Needs assistance;History of Falls Sitting-balance support: Single extremity supported;Feet supported Sitting balance-Leahy Scale: Fair Sitting  balance - Comments: Pt with one hand on bed rail or on mattress for balance support.   Standing balance support: Single extremity supported;During functional activity Standing balance-Leahy Scale: Poor Standing balance comment: Pt stood for 2 minutes at the side of the bed with her left hand on the bedrail and min a from therapist. Pt indicated feeling fearful to let go. Attempted sidestepping with pt unable to sequence sidestepping to the right.                             ADL Overall ADL's : Needs assistance/impaired Eating/Feeding: Set up;Sitting   Grooming: Minimal assistance;Brushing hair;Sitting   Upper Body Bathing: Moderate assistance;Sitting   Lower Body Bathing: Moderate assistance;+2 for safety/equipment;Sit to/from stand   Upper Body Dressing : Moderate assistance;Sitting   Lower Body Dressing: Moderate assistance;+2 for safety/equipment;Sit to/from stand                 General ADL Comments: Pt presents with cognitive deficits (sequencing, problem-solving, attention, memory) and generalized weakness impacting current level of assist with ADLs. Pt completed bedmobility and sit<>stand transfer as detailed below.      Vision     Perception     Praxis Praxis Praxis tested?: Deficits    Pertinent Vitals/Pain Pain Assessment: Faces Faces Pain Scale: Hurts little more Pain Location: Rt calf muscle in standing, RUE at rest and increased pain with right elbow flexion. Pain Intervention(s): Limited activity within patient's tolerance;Monitored during session;Repositioned     Hand Dominance  Extremity/Trunk Assessment Upper Extremity Assessment Upper Extremity Assessment: Generalized weakness;RUE deficits/detail RUE Deficits / Details: AROM LUE>RUE at shoulder level and extra time needed for composite flexion of Rt hand with limited opposition of pollex. Pt pointed to rt hand when c/o of pain upon therapist arrival (FACES 4). Pt stating, "It's  (pointing to RUE at forearm region) deformed" and "not working right." Pain indicated with elbow flexion. RUE Coordination: decreased fine motor (difficulty maintaining grasp on comb)   Lower Extremity Assessment Lower Extremity Assessment: Defer to PT evaluation   Cervical / Trunk Assessment Cervical / Trunk Assessment: Kyphotic   Communication Communication Communication: No difficulties   Cognition Arousal/Alertness: Awake/alert Behavior During Therapy: WFL for tasks assessed/performed Overall Cognitive Status: No family/caregiver present to determine baseline cognitive functioning (pt with hx of Dementia, but unclear if this is baseline.))       Memory: Decreased recall of precautions;Decreased short-term memory             General Comments       Exercises       Shoulder Instructions      Home Living Family/patient expects to be discharged to:: Skilled nursing facility Living Arrangements: Alone                               Additional Comments: from ILF per chart review      Prior Functioning/Environment Level of Independence: Needs assistance  Gait / Transfers Assistance Needed: Unclear level of mobility, but per chart was ambulatory at ALF.   ADL's / Homemaking Assistance Needed: pt resides at ALF and was recieving A for homemaking.          OT Diagnosis: Generalized weakness;Cognitive deficits;Acute pain   OT Problem List: Decreased strength;Decreased range of motion;Decreased activity tolerance;Impaired balance (sitting and/or standing);Decreased coordination;Decreased cognition;Decreased safety awareness;Decreased knowledge of use of DME or AE;Decreased knowledge of precautions;Impaired UE functional use   OT Treatment/Interventions: Self-care/ADL training;Therapeutic exercise;Neuromuscular education;DME and/or AE instruction;Therapeutic activities;Cognitive remediation/compensation;Patient/family education;Balance training    OT  Goals(Current goals can be found in the care plan section) Acute Rehab OT Goals Patient Stated Goal: pt unable to state. OT Goal Formulation: With patient Time For Goal Achievement: 01/04/15 Potential to Achieve Goals: Good ADL Goals Pt Will Perform Grooming: sitting;with supervision Pt Will Perform Upper Body Bathing: with min assist;sitting Pt Will Perform Lower Body Bathing: with mod assist Pt Will Perform Upper Body Dressing: with min assist;sitting Pt Will Perform Lower Body Dressing: with mod assist Pt Will Transfer to Toilet: with mod assist;with +2 assist;stand pivot transfer  OT Frequency: Min 2X/week   Barriers to D/C:            Co-evaluation              End of Session Equipment Utilized During Treatment: Gait belt Nurse Communication: Mobility status  Activity Tolerance: Patient tolerated treatment well Patient left: in bed;with call bell/phone within reach;with bed alarm set;with SCD's reapplied   Time: [id number] OT Time Calculation (min): 32 min Charges:  OT General Charges $OT Visit: 1 Procedure OT Evaluation $Initial OT Evaluation Tier I: 1 Procedure OT Treatments $Self Care/Home Management : 8-22 mins G-Codes:    Pilar GrammesMathews, Nils Thor H 12/21/2014, 9:54 AM

## 2014-12-21 NOTE — Progress Notes (Signed)
DC instructions given to patient and her daughter; [facility]. All questions answered. [facility] by staff. Daughter will transport to facility in private vehicle.

## 2014-12-21 NOTE — Clinical Social Work Placement (Signed)
   CLINICAL SOCIAL WORK PLACEMENT  NOTE  Date:  12/21/2014  Patient Details  Name: Dorothy BollKathleen H Trupiano MRN: [id number] Date of Birth: November 05, 1935  Clinical Social Work is seeking post-discharge placement for this patient at the Skilled  Nursing Facility level of care (*CSW will initial, date and re-position this form in  chart as items are completed):  Yes   Patient/family provided with [facility] Social Work Department's list of facilities offering this level of care within the geographic area requested by the patient (or if unable, by the patient's family).  Yes   Patient/family informed of their freedom to choose among providers that offer the needed level of care, that participate in Medicare, Medicaid or managed care program needed by the patient, have an available bed and are willing to accept the patient.  Yes   Patient/family informed of [facility] ownership interest [facility] and [facility], as well as of the fact that they are under no obligation to receive care at these facilities.  PASRR submitted to EDS on       PASRR number received on       Existing PASRR number confirmed on 12/20/14     FL2 transmitted to all facilities in geographic area requested by pt/family on 12/20/14     FL2 transmitted to all facilities within larger geographic area on       Patient informed that his/her managed care company has contracts with or will negotiate with certain facilities, including the following:        Yes   Patient/family informed of bed offers received.  [facility]     Physician recommends and patient chooses bed at      [facility] 12[facility].  Patient to be transferred to facility by Daughter Audry PiliDebra Brown     Patient family notified on 12/21/14 of transfer.  Name of family member notified:  Audry Piliebra Brown      PHYSICIAN       Additional Comment:     _______________________________________________ Gwynne EdingerBibbs, Quinteria Chisum, LCSW 12/21/2014, 10:18 AM

## 2014-12-21 NOTE — Discharge Summary (Signed)
Physician Discharge Summary  Dorothy Page ZOX:[id number] DOB: 1936-04-05 DOA: 12/19/2014  PCP: Hoyle SauerAVVA,RAVISANKAR R, MD  Admit date: 12/19/2014 Discharge date: 12/21/2014  Time spent: 35 minutes  Recommendations for Outpatient Follow-up:  Needs further B 12 supplementation.  Needs CT head in 10 days, needs to follow up with DR Jeral FruitBotero.  Might need SSI.  Adjust BP medications as needed.  Avoid ibuprofen, aspirin , plavix NSAID due to subdural hematoma.   Discharge Diagnoses:    Subacute subdural hematoma   B -12 deficiency   Acute encephalopathy   Type 2 diabetes mellitus with hyperglycemia   Essential hypertension      Discharge Condition: stable.   Diet recommendation: Carb modified.   Filed Weights   12/19/14 0551 12/19/14 1613  Weight: 75.751 kg (167 lb) 68 kg (149 lb 14.6 oz)    History of present illness:  79 year old female with a history of diabetes mellitus, stroke, hypertension, hyperlipidemia presented after a mechanical fall on the morning of admission. The patient has a history of dementia with cognitive decline and is unable to provide any significant history. All of this history is obtained from review of the medical record and speaking with the patient's daughter at the bedside. The patient's daughter stated that the patient had a mechanical fall approximately 2 weeks prior to this admission. She was evaluated by EMS at that time and was deemed to be stable without transferring to the hospital. The patient's daughter stated that for 2 days after mechanical fall the patient was in her usual mental status. However, on the third day the patient began making a cognitive and functional decline. Daughter stated that the patient has been more confused since then, and she has had more difficulty with and unsteady gait. Approximately one week prior to this admission, the patient got locked out of her independent living facility, and the patient could not figure out how to use  her daily to get back in. Apparently, the patient had a mechanical fall on the morning of admission which prompted her to be brought to the emergency department. CT of the brain in the emergency department revealed a subacute subdural hematoma. There've not been any reports of fevers, chills, chest pain, shortness breath, vomiting, diarrhea, abdominal pain.   In the emergency department, the patient was afebrile and hemodynamically stable. In fact she was hypertensive with blood pressure of 197/104. BMP and CBC were unremarkable. EKG shows sinus rhythm without any ST-T wave changes. Chest x-ray was negative. CT of the brain showed a left parietal subacute subdural hematoma.   Hospital Course:  Acute encephalopathy -The patient's cognitive and functional decline in the past 1-2 weeks is likely from a subdural hematoma that the patient may have developed after her initial mechanical fall 2 weeks prior to this admission. Also B 12 deficiency might play a role on functional decline.  -TSH 3.09 , ammonia 21 , RPR non reactive, B-12 low, started supplement.  -Urinalysis negative for significant pyuria  Subdural hematoma; Sub-acute -Neurosurgery, Dr. Jeral FruitBotero was consulted in ED-->no surgical intervention needed -PT/OT evaluation -Patient needs CT head in 10 days. Needs to follow up with Dr Jeral FruitBotero  B-12 deficiency; started supplement 100 mcg IM for 7 days. Needs further replacement.   Unsteady gait/mechanical falls -PT/OT evaluation -Check CPK -Might be related to B 12 deficiency.   Dementia -Continue Namenda -Held all when necessary agitation  History of stroke -Hold Plavix in the setting of subdural hematoma  Hypertension -Continue with avapro. Started  low  dose Norvasc. PRN hydralazine.   Diabetes mellitus type 2 -Discontinue metformin -Hemoglobin A1c 8.4, need better Blood sugar controlled.  -monitor CBG, might need SSI>   Procedures:  none  Consultations:  Dr Jeral Fruit, by  phone  Discharge Exam: Filed Vitals:   12/21/14 0929  BP: 145/69  Pulse: 75  Temp: 98.2 F (36.8 C)  Resp: 16    General: Alert, pleasantly confuse Cardiovascular: S 1, S 2 RRR Respiratory: CTA  Discharge Instructions   Discharge Instructions    Diet - low sodium heart healthy    Complete by:  As directed      Increase activity slowly    Complete by:  As directed           Current Discharge Medication List    START taking these medications   Details  amLODipine (NORVASC) 2.5 MG tablet Take 1 tablet (2.5 mg total) by mouth daily. Qty: 30 tablet, Refills: 0    cyanocobalamin (,VITAMIN B-12,) 1000 MCG/ML injection Inject 0.1 mLs (100 mcg total) into the muscle daily. Qty: 1 mL, Refills: 0      CONTINUE these medications which have NOT CHANGED   Details  escitalopram (LEXAPRO) 5 MG tablet Take 5 mg by mouth every morning. Refills: 6    irbesartan (AVAPRO) 75 MG tablet Take 75 mg by mouth daily. Refills: 6    metFORMIN (GLUCOPHAGE) 500 MG tablet Take 500 mg by mouth 2 (two) times daily. Refills: 6    NAMENDA XR 28 MG CP24 24 hr capsule Take 28 mg by mouth daily. Refills: 6    simvastatin (ZOCOR) 40 MG tablet Take 40 mg by mouth daily. Refills: 0      STOP taking these medications     clopidogrel (PLAVIX) 75 MG tablet        Allergies  Allergen Reactions  . Aspirin     REACTION: swelling   Follow-up Information    Follow up with Karn Cassis, MD In 2 weeks.   Specialty:  Neurosurgery   Contact information:   [address] [place] [address] [phone]       Follow up with Hoyle Sauer, MD In 1 week.   Specialty:  Internal Medicine   Contact information:   [address] [place] [address] [phone]        The results of significant diagnostics from this hospitalization (including imaging, microbiology, ancillary and laboratory) are listed below for reference.    Significant Diagnostic Studies: Ct  Head Wo Contrast  12/19/2014   CLINICAL DATA:  Fall at home.  Dementia.  EXAM: CT HEAD WITHOUT CONTRAST  CT CERVICAL SPINE WITHOUT CONTRAST  TECHNIQUE: Multidetector CT imaging of the head and cervical spine was performed following the standard protocol without intravenous contrast. Multiplanar CT image reconstructions of the cervical spine were also generated.  COMPARISON:  Cervical spine radiographs 03/04/2008. MRI brain 06/22/2007. CT head 06/21/2007.  FINDINGS: CT HEAD FINDINGS  There is a left parietal subdural hematoma measuring about 16 mm greatest depth. Density is most consistent with subacute phase. There is mild sulcal effacement. 4 mm left-to-right midline shift.  Diffuse cerebral atrophy. Mild ventricular dilatation consistent with central atrophy. Low-attenuation changes in the deep white matter consistent small vessel ischemic change. Gray-white matter junctions are distinct. Basal cisterns are not effaced. Calvarium appears intact. Mucosal thickening in the paranasal sinuses. Mastoid air cells are not opacified. Vascular calcifications.  CT CERVICAL SPINE FINDINGS  Normal alignment of the cervical spine and facet  joints. Diffuse degenerative changes with narrowed interspaces and associated endplate hypertrophic changes. Degenerative changes in the facet joints and at C1 to. No vertebral compression deformities. No prevertebral soft tissue swelling. No focal bone lesion or bone destruction. Bone cortex and trabecular architecture appear intact. Vascular calcifications in the cervical carotid arteries.  IMPRESSION: Left parietal subdural hematoma measuring about 16 mm greatest depth. Density most consistent with subacute phase. 4 mm left-to-right midline shift.  Degenerative changes in the cervical spine. Normal alignment. No acute displaced fractures identified.  These results were called by telephone at the time of interpretation on 12/19/2014 at 6:46 am to Dr. Dione BoozeAVID GLICK , who verbally acknowledged  these results.   Electronically Signed   By: Burman NievesWilliam  Stevens M.D.   On: 12/19/2014 06:48   Ct Cervical Spine Wo Contrast  12/19/2014   CLINICAL DATA:  Fall at home.  Dementia.  EXAM: CT HEAD WITHOUT CONTRAST  CT CERVICAL SPINE WITHOUT CONTRAST  TECHNIQUE: Multidetector CT imaging of the head and cervical spine was performed following the standard protocol without intravenous contrast. Multiplanar CT image reconstructions of the cervical spine were also generated.  COMPARISON:  Cervical spine radiographs 03/04/2008. MRI brain 06/22/2007. CT head 06/21/2007.  FINDINGS: CT HEAD FINDINGS  There is a left parietal subdural hematoma measuring about 16 mm greatest depth. Density is most consistent with subacute phase. There is mild sulcal effacement. 4 mm left-to-right midline shift.  Diffuse cerebral atrophy. Mild ventricular dilatation consistent with central atrophy. Low-attenuation changes in the deep white matter consistent small vessel ischemic change. Gray-white matter junctions are distinct. Basal cisterns are not effaced. Calvarium appears intact. Mucosal thickening in the paranasal sinuses. Mastoid air cells are not opacified. Vascular calcifications.  CT CERVICAL SPINE FINDINGS  Normal alignment of the cervical spine and facet joints. Diffuse degenerative changes with narrowed interspaces and associated endplate hypertrophic changes. Degenerative changes in the facet joints and at C1 to. No vertebral compression deformities. No prevertebral soft tissue swelling. No focal bone lesion or bone destruction. Bone cortex and trabecular architecture appear intact. Vascular calcifications in the cervical carotid arteries.  IMPRESSION: Left parietal subdural hematoma measuring about 16 mm greatest depth. Density most consistent with subacute phase. 4 mm left-to-right midline shift.  Degenerative changes in the cervical spine. Normal alignment. No acute displaced fractures identified.  These results were called by  telephone at the time of interpretation on 12/19/2014 at 6:46 am to Dr. Dione BoozeAVID GLICK , who verbally acknowledged these results.   Electronically Signed   By: Burman NievesWilliam  Stevens M.D.   On: 12/19/2014 06:48   Dg Chest Port 1 View  12/19/2014   CLINICAL DATA:  Altered mental status  EXAM: PORTABLE CHEST - 1 VIEW  COMPARISON:  None.  FINDINGS: Normal mediastinum and cardiac silhouette. Normal pulmonary vasculature. No evidence of effusion, infiltrate, or pneumothorax. No acute bony abnormality.  IMPRESSION: No acute cardiopulmonary process.   Electronically Signed   By: Genevive BiStewart  Edmunds M.D.   On: 12/19/2014 11:15    Microbiology: No results found for this or any previous visit (from the past 240 hour(s)).   Labs: Basic Metabolic Panel:  Recent Labs Lab 12/19/14 1115 12/20/14 0354  NA 141 139  K 4.2 4.0  CL 105 104  CO2 24 28  GLUCOSE 210* 187*  BUN 9 9  CREATININE 0.93 0.90  CALCIUM 9.3 8.9   Liver Function Tests:  Recent Labs Lab 12/20/14 0354  AST 17  ALT 12*  ALKPHOS 101  BILITOT 1.4*  PROT 6.4*  ALBUMIN 3.2*   No results for input(s): LIPASE, AMYLASE in the last 168 hours.  Recent Labs Lab 12/19/14 1805  AMMONIA 21   CBC:  Recent Labs Lab 12/19/14 1115 12/19/14 1805 12/20/14 0354  WBC 8.3  --  7.4  NEUTROABS 4.5  --   --   HGB 14.4  --  14.2  HCT 43.0 39.8 43.5  MCV 88.3  --  89.3  PLT 212  --  213   Cardiac Enzymes:  Recent Labs Lab 12/19/14 1115 12/19/14 1805  CKTOTAL  --  219  TROPONINI <0.03  --    BNP: BNP (last 3 results) No results for input(s): BNP in the last 8760 hours.  ProBNP (last 3 results) No results for input(s): PROBNP in the last 8760 hours.  CBG:  Recent Labs Lab 12/20/14 0641 12/20/14 1137 12/20/14 1627 12/20/14 2215 12/21/14 0639  GLUCAP 186* 159* 153* 167* 207*       Signed:  Desiree Daise A  [facility] 12/21/2014, 10:16 AM

## 2014-12-21 NOTE — Progress Notes (Signed)
Inpatient Diabetes Program Recommendations  AACE/ADA: New Consensus Statement on Inpatient Glycemic Control (2013)  Target Ranges:  Prepandial:   less than 140 mg/dL      Peak postprandial:   less than 180 mg/dL (1-2 hours)      Critically ill patients:  140 - 180 mg/dL   Results for Clance BollSTEED, Ky H (MRN [id number]) as of 12/21/2014 10:10  Ref. Range 12/20/2014 06:41 12/20/2014 11:37 12/20/2014 16:27 12/20/2014 22:15 12/21/2014 06:39  Glucose-Capillary Latest Ref Range: 65-99 mg/dL 782186 (H) 956159 (H) 213153 (H) 167 (H) 207 (H)   Diabetes history: DM 2 Outpatient Diabetes medications: Metformin 500 mg BID Current orders for Inpatient glycemic control: Novolog 0-9 units TID  Inpatient Diabetes Program Recommendations  Insulin - Basal: Patient's glucose is increasing into the 200 range. Please consider ordering low dose basal since patient is not currently taking her home Metformin while inpatient (Lantus 8 units Q24 hrs, 0.1 units/kg).   Thanks,  Christena DeemShannon Haidy Kackley RN, MSN, [facility] Inpatient Diabetes Coordinator Team Pager [phone]

## 2015-04-19 ENCOUNTER — Emergency Department (HOSPITAL_COMMUNITY)
Admission: EM | Admit: 2015-04-19 | Discharge: 2015-04-20 | Disposition: A | Discharge status: Home / Self Care | Payer: Commercial Managed Care - HMO | Attending: Emergency Medicine | Admitting: Emergency Medicine

## 2015-04-19 ENCOUNTER — Emergency Department (HOSPITAL_COMMUNITY): Payer: Commercial Managed Care - HMO

## 2015-04-19 ENCOUNTER — Encounter (HOSPITAL_COMMUNITY): Payer: Self-pay | Admitting: Family Medicine

## 2015-04-19 DIAGNOSIS — Y998 Other external cause status: Secondary | ICD-10-CM | POA: Insufficient documentation

## 2015-04-19 DIAGNOSIS — Z8673 Personal history of transient ischemic attack (TIA), and cerebral infarction without residual deficits: Secondary | ICD-10-CM | POA: Insufficient documentation

## 2015-04-19 DIAGNOSIS — E119 Type 2 diabetes mellitus without complications: Secondary | ICD-10-CM | POA: Insufficient documentation

## 2015-04-19 DIAGNOSIS — W01198A Fall on same level from slipping, tripping and stumbling with subsequent striking against other object, initial encounter: Secondary | ICD-10-CM | POA: Insufficient documentation

## 2015-04-19 DIAGNOSIS — Z043 Encounter for examination and observation following other accident: Secondary | ICD-10-CM | POA: Diagnosis present

## 2015-04-19 DIAGNOSIS — Y9389 Activity, other specified: Secondary | ICD-10-CM | POA: Insufficient documentation

## 2015-04-19 DIAGNOSIS — F0390 Unspecified dementia without behavioral disturbance: Secondary | ICD-10-CM | POA: Diagnosis not present

## 2015-04-19 DIAGNOSIS — Z8669 Personal history of other diseases of the nervous system and sense organs: Secondary | ICD-10-CM | POA: Insufficient documentation

## 2015-04-19 DIAGNOSIS — I10 Essential (primary) hypertension: Secondary | ICD-10-CM | POA: Insufficient documentation

## 2015-04-19 DIAGNOSIS — Z872 Personal history of diseases of the skin and subcutaneous tissue: Secondary | ICD-10-CM | POA: Insufficient documentation

## 2015-04-19 DIAGNOSIS — Z79899 Other long term (current) drug therapy: Secondary | ICD-10-CM | POA: Diagnosis not present

## 2015-04-19 DIAGNOSIS — W19XXXA Unspecified fall, initial encounter: Secondary | ICD-10-CM

## 2015-04-19 DIAGNOSIS — Y92128 Other place in nursing home as the place of occurrence of the external cause: Secondary | ICD-10-CM | POA: Insufficient documentation

## 2015-04-19 LAB — CBC WITH DIFFERENTIAL/PLATELET
BASOS ABS: 0 10*3/uL (ref 0.0–0.1)
BASOS PCT: 0 % (ref 0–1)
EOS ABS: 0.3 10*3/uL (ref 0.0–0.7)
EOS PCT: 4 % (ref 0–5)
HCT: 41.9 % (ref 36.0–46.0)
HEMOGLOBIN: 13.7 g/dL (ref 12.0–15.0)
Lymphocytes Relative: 43 % (ref 12–46)
Lymphs Abs: 3.8 10*3/uL (ref 0.7–4.0)
MCH: 28.8 pg (ref 26.0–34.0)
MCHC: 32.7 g/dL (ref 30.0–36.0)
MCV: 88.2 fL (ref 78.0–100.0)
Monocytes Absolute: 0.6 10*3/uL (ref 0.1–1.0)
Monocytes Relative: 7 % (ref 3–12)
NEUTROS PCT: 46 % (ref 43–77)
Neutro Abs: 4.1 10*3/uL (ref 1.7–7.7)
PLATELETS: 225 10*3/uL (ref 150–400)
RBC: 4.75 MIL/uL (ref 3.87–5.11)
RDW: 14.7 % (ref 11.5–15.5)
WBC: 8.8 10*3/uL (ref 4.0–10.5)

## 2015-04-19 LAB — BASIC METABOLIC PANEL
Anion gap: 9 (ref 5–15)
BUN: 18 mg/dL (ref 6–20)
CHLORIDE: 108 mmol/L (ref 101–111)
CO2: 24 mmol/L (ref 22–32)
Calcium: 9.4 mg/dL (ref 8.9–10.3)
Creatinine, Ser: 0.89 mg/dL (ref 0.44–1.00)
Glucose, Bld: 138 mg/dL — ABNORMAL HIGH (ref 65–99)
POTASSIUM: 3.7 mmol/L (ref 3.5–5.1)
SODIUM: 141 mmol/L (ref 135–145)

## 2015-04-19 LAB — CBG MONITORING, ED: GLUCOSE-CAPILLARY: 147 mg/dL — AB (ref 65–99)

## 2015-04-19 NOTE — ED Provider Notes (Signed)
CSN: [id number]     Arrival date & time 04/19/15  2138 History   First MD Initiated Contact with Patient 04/19/15 2150     Chief Complaint  Patient presents with  . Fall     (Consider location/radiation/quality/duration/timing/severity/associated sxs/prior Treatment) Patient is a 79 y.o. female presenting with fall. The history is provided by the patient. No language interpreter was used.  Fall  Ms. Keir is a 79 year old female with a history of diabetes, stroke, neuromuscular disorder, hypertension, hyperlipidemia and dementia who presents by EMS from [facility] recovery center because she was found by staff sitting up against the wall. The facility reports that she fell back and hit her head against the wall but that it was an unwitnessed fall off. She is unable to give me a history secondary to dementia. Level V caveat.  Past Medical History  Diagnosis Date  . Diabetes mellitus   . Allergy   . Stroke   . Neuromuscular disorder     mild paralysis  . Hypertension   . Hyperlipidemia     borderline  . Sebaceous cyst   . Dementia    Past Surgical History  Procedure Laterality Date  . Abdominal hysterectomy    . Colonoscopy    . Cystectomy     Family History  Problem Relation Age of Onset  . Heart disease Mother   . Diabetes Father   . Diabetes Brother    Social History  Substance Use Topics  . Smoking status: Never Smoker   . Smokeless tobacco: None  . Alcohol Use: No   OB History    No data available     Review of Systems  Unable to perform ROS: Dementia      Allergies  Aspirin  Home Medications   Prior to Admission medications   Medication Sig Start Date End Date Taking? Authorizing Provider  amLODipine (NORVASC) 2.5 MG tablet Take 1 tablet (2.5 mg total) by mouth daily. 12/21/14  Yes Belkys A Regalado, MD  Cholecalciferol (VITAMIN D-3) 1000 UNITS CAPS Take 1 capsule by mouth daily.   Yes Historical Provider, MD  Cyanocobalamin  (VITAMIN B 12 PO) Take 1,000 mg by mouth daily.   Yes Historical Provider, MD  escitalopram (LEXAPRO) 5 MG tablet Take 5 mg by mouth every morning. 11/20/14  Yes Historical Provider, MD  irbesartan (AVAPRO) 75 MG tablet Take 75 mg by mouth daily. 11/20/14  Yes Historical Provider, MD  mirtazapine (REMERON) 30 MG tablet Take 30 mg by mouth at bedtime. Appetite stimulant.   Yes Historical Provider, MD  NAMENDA XR 28 MG CP24 24 hr capsule Take 28 mg by mouth daily. 11/20/14  Yes Historical Provider, MD   BP 149/79 mmHg  Pulse 70  Temp(Src) 98.5 F (36.9 C) (Oral)  Resp 20  SpO2 98% Physical Exam  Constitutional: She appears well-developed and well-nourished. No distress.  HENT:  Head: Normocephalic.  No signs of head trauma.  Eyes: Conjunctivae are normal.  Neck: Normal range of motion and full passive range of motion without pain. Neck supple. No spinous process tenderness and no muscular tenderness present.  Full passive range of motion without pain.  Cardiovascular: Normal rate, regular rhythm and normal heart sounds.   Pulmonary/Chest: Effort normal and breath sounds normal. No respiratory distress. She has no wheezes. She has no rales.  Lungs are clear to auscultation bilaterally.   Abdominal: Soft. She exhibits no distension. There is no tenderness. There is no rebound and no guarding.  Abdomen is soft and non tender.  No distention, guarding or rebound.   Musculoskeletal: Normal range of motion. She exhibits no edema or tenderness.  Able to move all extremities without difficulty. No ecchymosis or lacerations noted.  Neurological: She is alert. GCS eye subscore is 4. GCS verbal subscore is 5. GCS motor subscore is 6.  Skin: Skin is warm and dry.  Nursing note and vitals reviewed.   ED Course  Procedures (including critical care time) Labs Review Labs Reviewed  BASIC METABOLIC PANEL - Abnormal; Notable for the following:    Glucose, Bld 138 (*)    All other components within  normal limits  URINALYSIS, ROUTINE W REFLEX MICROSCOPIC (NOT AT [facility]) - Abnormal; Notable for the following:    Color, Urine AMBER (*)    APPearance CLOUDY (*)    Bilirubin Urine SMALL (*)    All other components within normal limits  CBG MONITORING, ED - Abnormal; Notable for the following:    Glucose-Capillary 147 (*)    All other components within normal limits  CBC WITH DIFFERENTIAL/PLATELET    Imaging Review Ct Head Wo Contrast  04/19/2015   CLINICAL DATA:  79 year old female with fall  EXAM: CT HEAD WITHOUT CONTRAST  TECHNIQUE: Contiguous axial images were obtained from the base of the skull through the vertex without intravenous contrast.  COMPARISON:  Head CT dated 12/19/2014  FINDINGS: There is a small hypodense left parietal subdural collection measuring up to 4 mm in greatest transverse thickness, likely residual blood seen on the prior study. No acute/early of hemorrhage identified. There is no mass effect or midline shift.  The ventricles are dilated and the sulci are prominent compatible with age-related atrophy. Periventricular and deep white matter hypodensities represent chronic microvascular ischemic changes.  The visualized paranasal sinuses and mastoid air cells are well aerated. The calvarium is intact.  IMPRESSION: No acute intracranial hemorrhage. Trace residual left parietal subdural hemorrhage from prior study noted.  Age-related atrophy and chronic microvascular ischemic changes.   Electronically Signed   By: Elgie Collard M.D.   On: 04/19/2015 23:19   I have personally reviewed and evaluated these images and lab results as part of my medical decision-making.   EKG Interpretation None      MDM   Final diagnoses:  Fall, initial encounter   Patient presents after unwitnessed fall. Patient is unable to give me clear history secondary to dementia. She is well-appearing and in no acute distress. Her vital signs are stable. She has no signs of trauma. CT head is  negative for any acute intercranial hemorrhage. Her labs are unremarkable.  She is safe for discharge back to rehab and recovery center and can follow up with her pcp as written on the discharge instructions.      Catha Gosselin, PA-C 04/20/15 0031  Eber Hong, MD 04/21/15 2154

## 2015-04-19 NOTE — ED Provider Notes (Signed)
The patient was found sitting up against a wall at her nursing facility, unwitnessed fall. She has no complaints. On exam she has no hematomas, no tenderness to palpation over her head or cervical spine, moves all 4 extremities with normal coordination, normal speech, normal strength, cardiac and pulmonary exams are also unremarkable and has a nontender abdomen. She appears in no distress  Medical screening examination/treatment/procedure(s) were conducted as a shared visit with non-physician practitioner(s) and myself.  I personally evaluated the patient during the encounter.  Clinical Impression:   Final diagnoses:  Fall, initial encounter         Eber Hong, MD 04/21/15 2154

## 2015-04-19 NOTE — ED Notes (Signed)
Bed: [facility] Expected date:  Expected time:  Means of arrival:  Comments: EMS/4F/fall/hit head on wall

## 2015-04-19 NOTE — ED Notes (Signed)
Patient is from [facility]. Per EMS, patient was found by staff sitting up against the wall. Facility reports she fell back and hit her head against the wall but it was an unwitnessed fall. Pt has no complaints.

## 2015-04-19 NOTE — ED Notes (Signed)
Ambulated patient to the restroom and back to bed. Pt was unable to urinate. Pt tolerated ambulation with assistance well.

## 2015-04-19 NOTE — ED Notes (Signed)
No signs of trauma or bruising to the area.

## 2015-04-19 NOTE — Discharge Instructions (Signed)
Fall Prevention in Hospitals Follow up with your primary care physician in 2 days.  As a hospital patient, your condition and the treatments you receive can increase your risk for falls. Some additional risk factors for falls in a hospital include:  Being in an unfamiliar environment.  Being on bed rest.  Your surgery.  Taking certain medicines.  Your tubing requirements, such as intravenous (IV) therapy or catheters. It is important that you learn how to decrease fall risks while at the hospital. Below are important tips that can help prevent falls. SAFETY TIPS FOR PREVENTING FALLS Talk about your risk of falling.  Ask your caregiver why you are at risk for falling. Is it your medicine, illness, tubing placement, or something else?  Make a plan with your caregiver to keep you safe from falls.  Ask your caregiver or pharmacist about side effect of your medicines. Some medicines can make you dizzy or affect your coordination. Ask for help.  Ask for help before getting out of bed. You may need to press your call button.  Ask for assistance in getting you safely to the toilet.  Ask for a walker or cane to be put at your bedside. Ask that most of the side rails on your bed be placed up before your caregiver leaves the room.  Ask family or friends to sit with you.  Ask for things that are out of your reach, such as your glasses, hearing aids, telephone, bedside table, or call button. Follow these tips to avoid falling:  Stay lying or seated, rather than standing, while waiting for help.  Wear rubber-soled slippers or shoes whenever you walk in the hospital.  Avoid quick, sudden movements.  Change positions slowly.  Sit on the side of your bed before standing.  Stand up slowly and wait before you start to walk.  Let your caregiver know if there is a spill on the floor.  Pay careful attention to the medical equipment, electrical cords, and tubes around you.  When you need  help, use your call button by your bed or in the bathroom. Wait for one of your caregivers to help you.  If you feel dizzy or unsure of your footing, return to bed and wait for assistance.  Avoid being distracted by the TV, telephone, or another person in your room.  Do not lean or support yourself on rolling objects, such as IV poles or bedside tables. Document Released: 07/20/2000 Document Revised: 07/09/2012 Document Reviewed: 03/30/2012 [facility] Patient Information 2015 [place], [place]. This information is not intended to replace advice given to you by your health care provider. Make sure you discuss any questions you have with your health care provider.

## 2015-04-20 LAB — URINALYSIS, ROUTINE W REFLEX MICROSCOPIC
GLUCOSE, UA: NEGATIVE mg/dL
HGB URINE DIPSTICK: NEGATIVE
KETONES UR: NEGATIVE mg/dL
LEUKOCYTES UA: NEGATIVE
Nitrite: NEGATIVE
PROTEIN: NEGATIVE mg/dL
Specific Gravity, Urine: 1.028 (ref 1.005–1.030)
UROBILINOGEN UA: 0.2 mg/dL (ref 0.0–1.0)
pH: 5 (ref 5.0–8.0)

## 2015-05-25 ENCOUNTER — Emergency Department (HOSPITAL_BASED_OUTPATIENT_CLINIC_OR_DEPARTMENT_OTHER): Payer: Commercial Managed Care - HMO

## 2015-05-25 ENCOUNTER — Emergency Department (HOSPITAL_BASED_OUTPATIENT_CLINIC_OR_DEPARTMENT_OTHER)
Admission: EM | Admit: 2015-05-25 | Discharge: 2015-05-25 | Disposition: A | Discharge status: Home / Self Care | Payer: Commercial Managed Care - HMO | Attending: Emergency Medicine | Admitting: Emergency Medicine

## 2015-05-25 ENCOUNTER — Encounter (HOSPITAL_BASED_OUTPATIENT_CLINIC_OR_DEPARTMENT_OTHER): Payer: Self-pay | Admitting: *Deleted

## 2015-05-25 DIAGNOSIS — I10 Essential (primary) hypertension: Secondary | ICD-10-CM | POA: Insufficient documentation

## 2015-05-25 DIAGNOSIS — E785 Hyperlipidemia, unspecified: Secondary | ICD-10-CM | POA: Insufficient documentation

## 2015-05-25 DIAGNOSIS — W01198A Fall on same level from slipping, tripping and stumbling with subsequent striking against other object, initial encounter: Secondary | ICD-10-CM | POA: Diagnosis not present

## 2015-05-25 DIAGNOSIS — S0003XA Contusion of scalp, initial encounter: Secondary | ICD-10-CM | POA: Insufficient documentation

## 2015-05-25 DIAGNOSIS — Z8669 Personal history of other diseases of the nervous system and sense organs: Secondary | ICD-10-CM | POA: Insufficient documentation

## 2015-05-25 DIAGNOSIS — Y998 Other external cause status: Secondary | ICD-10-CM | POA: Diagnosis not present

## 2015-05-25 DIAGNOSIS — N390 Urinary tract infection, site not specified: Secondary | ICD-10-CM | POA: Insufficient documentation

## 2015-05-25 DIAGNOSIS — E119 Type 2 diabetes mellitus without complications: Secondary | ICD-10-CM | POA: Diagnosis not present

## 2015-05-25 DIAGNOSIS — Z872 Personal history of diseases of the skin and subcutaneous tissue: Secondary | ICD-10-CM | POA: Diagnosis not present

## 2015-05-25 DIAGNOSIS — F0390 Unspecified dementia without behavioral disturbance: Secondary | ICD-10-CM | POA: Insufficient documentation

## 2015-05-25 DIAGNOSIS — Y9289 Other specified places as the place of occurrence of the external cause: Secondary | ICD-10-CM | POA: Insufficient documentation

## 2015-05-25 DIAGNOSIS — W19XXXA Unspecified fall, initial encounter: Secondary | ICD-10-CM

## 2015-05-25 DIAGNOSIS — Z79899 Other long term (current) drug therapy: Secondary | ICD-10-CM | POA: Diagnosis not present

## 2015-05-25 DIAGNOSIS — Y9389 Activity, other specified: Secondary | ICD-10-CM | POA: Insufficient documentation

## 2015-05-25 DIAGNOSIS — Z8673 Personal history of transient ischemic attack (TIA), and cerebral infarction without residual deficits: Secondary | ICD-10-CM | POA: Diagnosis not present

## 2015-05-25 DIAGNOSIS — T148XXA Other injury of unspecified body region, initial encounter: Secondary | ICD-10-CM

## 2015-05-25 DIAGNOSIS — S0990XA Unspecified injury of head, initial encounter: Secondary | ICD-10-CM | POA: Diagnosis present

## 2015-05-25 HISTORY — DX: Encephalopathy, unspecified: G93.40

## 2015-05-25 HISTORY — DX: Traumatic subdural hemorrhage with loss of consciousness status unknown, initial encounter: S06.5XAA

## 2015-05-25 HISTORY — DX: Traumatic subdural hemorrhage with loss of consciousness of unspecified duration, initial encounter: S06.5X9A

## 2015-05-25 LAB — URINALYSIS, ROUTINE W REFLEX MICROSCOPIC
BILIRUBIN URINE: NEGATIVE
Glucose, UA: NEGATIVE mg/dL
KETONES UR: NEGATIVE mg/dL
NITRITE: POSITIVE — AB
PROTEIN: 30 mg/dL — AB
SPECIFIC GRAVITY, URINE: 1.019 (ref 1.005–1.030)
UROBILINOGEN UA: 1 mg/dL (ref 0.0–1.0)
pH: 7 (ref 5.0–8.0)

## 2015-05-25 LAB — CBG MONITORING, ED: GLUCOSE-CAPILLARY: 156 mg/dL — AB (ref 65–99)

## 2015-05-25 LAB — URINE MICROSCOPIC-ADD ON

## 2015-05-25 MED ORDER — CEPHALEXIN 500 MG PO CAPS: 500.0000 mg | ORAL_CAPSULE | Freq: Two times a day (BID) | ORAL | Status: AC

## 2015-05-25 MED ORDER — CEPHALEXIN 250 MG PO CAPS
500.0000 mg | ORAL_CAPSULE | Freq: Once | ORAL | Status: AC
  Administered 2015-05-25: 500 mg via ORAL
  Filled 2015-05-25: qty 2

## 2015-05-25 NOTE — ED Notes (Signed)
[facility] after hours called for pt transport back to [facility].

## 2015-05-25 NOTE — ED Notes (Signed)
Patient transported to CT 

## 2015-05-25 NOTE — ED Notes (Signed)
C-collar removed per [facility]OR[facility] from Arthor CaptainAbigail Harris, EDPA. Pt's daughter (and HCPOA) called. States she is coming to ED to see pt. Dorothy Page [phone]. Also spoke with Nelson Chimesiana Stevens, RN who called to notify that pt is a hospice patient

## 2015-05-25 NOTE — ED Notes (Signed)
Pt had [facility]- Hx of dementia- No LOC per EMS report- hematoma to posterior scalp

## 2015-05-25 NOTE — ED Provider Notes (Signed)
CSN: [id number]     Arrival date & time 05/25/15  1631 History   First MD Initiated Contact with Patient 05/25/15 1643     Chief Complaint  Patient presents with  . Fall     (Consider location/radiation/quality/duration/timing/severity/associated sxs/prior Treatment) HPI   Systems 79 year old female who presents the emergency department from her skilled nursing facility for a fall. She is past medical history of dementia and there is a level V caveat. The patient had a witnessed fall just prior to arrival. She fell backward and hit her head against the wall. She stated have a developing hematoma on the right occiput. At that time.  Past Medical History  Diagnosis Date  . Diabetes mellitus   . Allergy   . Stroke (HCC)   . Neuromuscular disorder (HCC)     mild paralysis  . Hypertension   . Hyperlipidemia     borderline  . Sebaceous cyst   . Dementia   . Encephalopathy   . Subdural hematoma [facility](HCC)    Past Surgical History  Procedure Laterality Date  . Abdominal hysterectomy    . Colonoscopy    . Cystectomy     Family History  Problem Relation Age of Onset  . Heart disease Mother   . Diabetes Father   . Diabetes Brother    Social History  Substance Use Topics  . Smoking status: Never Smoker   . Smokeless tobacco: None  . Alcohol Use: No   OB History    No data available     Review of Systems Unable to review systems due to dementia  Allergies  Aspirin  Home Medications   Prior to Admission medications   Medication Sig Start Date End Date Taking? Authorizing Provider  amLODipine (NORVASC) 2.5 MG tablet Take 1 tablet (2.5 mg total) by mouth daily. 12/21/14  Yes Belkys A Regalado, MD  Cholecalciferol (VITAMIN D-3) 1000 UNITS CAPS Take 1 capsule by mouth daily.   Yes Historical Provider, MD  escitalopram (LEXAPRO) 5 MG tablet Take 5 mg by mouth every morning. 11/20/14  Yes Historical Provider, MD  irbesartan (AVAPRO) 75 MG tablet Take 75 mg by mouth daily.  11/20/14  Yes Historical Provider, MD  mirtazapine (REMERON) 30 MG tablet Take 30 mg by mouth at bedtime. Appetite stimulant.   Yes Historical Provider, MD  NAMENDA XR 28 MG CP24 24 hr capsule Take 28 mg by mouth daily. 11/20/14  Yes Historical Provider, MD  polyethylene glycol (MIRALAX / GLYCOLAX) packet Take 17 g by mouth daily.   Yes Historical Provider, MD  cephALEXin (KEFLEX) 500 MG capsule Take 1 capsule (500 mg total) by mouth 2 (two) times daily. 05/25/15   Arthor CaptainAbigail Blaise Palladino, PA-C  Cyanocobalamin (VITAMIN B 12 PO) Take 1,000 mg by mouth daily.    Historical Provider, MD   BP 133/101 mmHg  Pulse 75  Temp(Src) 98.1 F (36.7 C) (Oral)  Resp 18  SpO2 100% Physical Exam  Constitutional: She appears well-developed and well-nourished. No distress.  HENT:  Head: Normocephalic.  Nose: Nose normal.  Mouth/Throat: Uvula is midline, oropharynx is clear and moist and mucous membranes are normal.  Hematoma of the occiput  Eyes: Conjunctivae and EOM are normal. Pupils are equal, round, and reactive to light.  Neck: No spinous process tenderness and no muscular tenderness present. No rigidity. Normal range of motion present.  Patient in cervical collar  Cardiovascular: Normal rate, regular rhythm and intact distal pulses.   Pulses:      Radial pulses are  2+ on the right side, and 2+ on the left side.       Dorsalis pedis pulses are 2+ on the right side, and 2+ on the left side.       Posterior tibial pulses are 2+ on the right side, and 2+ on the left side.  Pulmonary/Chest: Effort normal and breath sounds normal. No accessory muscle usage. No respiratory distress. She has no decreased breath sounds. She has no wheezes. She has no rhonchi. She has no rales. She exhibits no tenderness and no bony tenderness.  No seatbelt marks No flail segment, crepitus or deformity Equal chest expansion  Abdominal: Soft. Normal appearance and bowel sounds are normal. There is no tenderness. There is no rigidity,  no guarding and no CVA tenderness.  No seatbelt marks Abd soft and nontender  Musculoskeletal: Normal range of motion.       Thoracic back: She exhibits normal range of motion.       Lumbar back: She exhibits normal range of motion.  Lymphadenopathy:    She has no cervical adenopathy.  Neurological: She is alert. She has normal reflexes. No cranial nerve deficit. GCS eye subscore is 4. GCS verbal subscore is 5. GCS motor subscore is 6.  Reflex Scores:      Bicep reflexes are 2+ on the right side and 2+ on the left side.      Brachioradialis reflexes are 2+ on the right side and 2+ on the left side.      Patellar reflexes are 2+ on the right side and 2+ on the left side.      Achilles reflexes are 2+ on the right side and 2+ on the left side. Speech is clear . Patient is confused, follows commands  Normal 5/5 strength in upper and lower extremities bilaterally including dorsiflexion and plantar flexion, strong and equal grip strength Sensation normal to light and sharp touch Moves extremities without ataxia, coordination intact No Clonus  Skin: Skin is warm and dry. No rash noted. She is not diaphoretic. No erythema.  Psychiatric: She has a normal mood and affect.  Nursing note and vitals reviewed.   ED Course  Procedures (including critical care time) Labs Review Labs Reviewed  URINALYSIS, ROUTINE W REFLEX MICROSCOPIC (NOT AT [facility]) - Abnormal; Notable for the following:    APPearance TURBID (*)    Hgb urine dipstick MODERATE (*)    Protein, ur 30 (*)    Nitrite POSITIVE (*)    Leukocytes, UA LARGE (*)    All other components within normal limits  URINE MICROSCOPIC-ADD ON - Abnormal; Notable for the following:    Squamous Epithelial / LPF FEW (*)    Bacteria, UA MANY (*)    All other components within normal limits  CBG MONITORING, ED - Abnormal; Notable for the following:    Glucose-Capillary 156 (*)    All other components within normal limits  URINE CULTURE    Imaging  Review Ct Head Wo Contrast  05/25/2015  CLINICAL DATA:  Witnessed fall, posterior head injury, history of subdural hematoma, encephalopathy, dementia, prior stroke. EXAM: CT HEAD WITHOUT CONTRAST CT CERVICAL SPINE WITHOUT CONTRAST TECHNIQUE: Multidetector CT imaging of the head and cervical spine was performed following the standard protocol without intravenous contrast. Multiplanar CT image reconstructions of the cervical spine were also generated. COMPARISON:  CT head dated 04/19/2015. CT head/cervical spine dated 12/19/2014. FINDINGS: CT HEAD FINDINGS Minimal soft tissue prominence along the left frontoparietal convexity (series 2/ image 22), improved, possibly  dural thickening/scarring related to prior subdural hematoma versus trace residual extra-axial collection. No acute extra-axial fluid collection or parenchymal hemorrhage. No mass lesion, mass effect, or midline shift. No CT evidence of acute infarction. Subcortical white matter and periventricular small vessel ischemic changes. Intracranial atherosclerosis. Global cortical and central atrophy. Secondary ventricular prominence. Partial opacification of the right frontal and ethmoid sinuses. Mastoid air cells are clear. No evidence of calvarial fracture. CT CERVICAL SPINE FINDINGS Exaggerated cervical lordosis. No evidence of fracture or dislocation. Vertebral body heights and intervertebral disc spaces are maintained. No prevertebral soft tissue swelling. Mild to moderate multilevel degenerative changes. Visualized thyroid is unremarkable. Visualized lung apices are essentially clear. IMPRESSION: Minimal soft tissue prominence along the left frontoparietal convexity, improved, possibly dural thickening/scarring related to prior subdural hematoma. No evidence of acute intracranial abnormality. Atrophy with small vessel ischemic changes. No evidence of traumatic injury to the cervical spine. Electronically Signed   By: Charline Bills M.D.   On:  05/25/2015 17:28   Ct Cervical Spine Wo Contrast  05/25/2015  CLINICAL DATA:  Witnessed fall, posterior head injury, history of subdural hematoma, encephalopathy, dementia, prior stroke. EXAM: CT HEAD WITHOUT CONTRAST CT CERVICAL SPINE WITHOUT CONTRAST TECHNIQUE: Multidetector CT imaging of the head and cervical spine was performed following the standard protocol without intravenous contrast. Multiplanar CT image reconstructions of the cervical spine were also generated. COMPARISON:  CT head dated 04/19/2015. CT head/cervical spine dated 12/19/2014. FINDINGS: CT HEAD FINDINGS Minimal soft tissue prominence along the left frontoparietal convexity (series 2/ image 22), improved, possibly dural thickening/scarring related to prior subdural hematoma versus trace residual extra-axial collection. No acute extra-axial fluid collection or parenchymal hemorrhage. No mass lesion, mass effect, or midline shift. No CT evidence of acute infarction. Subcortical white matter and periventricular small vessel ischemic changes. Intracranial atherosclerosis. Global cortical and central atrophy. Secondary ventricular prominence. Partial opacification of the right frontal and ethmoid sinuses. Mastoid air cells are clear. No evidence of calvarial fracture. CT CERVICAL SPINE FINDINGS Exaggerated cervical lordosis. No evidence of fracture or dislocation. Vertebral body heights and intervertebral disc spaces are maintained. No prevertebral soft tissue swelling. Mild to moderate multilevel degenerative changes. Visualized thyroid is unremarkable. Visualized lung apices are essentially clear. IMPRESSION: Minimal soft tissue prominence along the left frontoparietal convexity, improved, possibly dural thickening/scarring related to prior subdural hematoma. No evidence of acute intracranial abnormality. Atrophy with small vessel ischemic changes. No evidence of traumatic injury to the cervical spine. Electronically Signed   By: Charline Bills M.D.   On: 05/25/2015 17:28   I have personally reviewed and evaluated these images and lab results as part of my medical decision-making.   EKG Interpretation None      MDM   Final diagnoses:  Fall, initial encounter  Hematoma  UTI (lower urinary tract infection)      This is 80 year old female with dementia. Her CT scan of the head and neck show no acute abnormalities. She does have apparent urinary tract infection. Patient will be given a dose of oral Keflex here, discharge him to begin Keflex. She appears safe for discharge at this time.    Arthor Captain, PA-C 05/25/15 1841  Geoffery Lyons, MD 05/25/15 2044

## 2015-05-25 NOTE — Discharge Instructions (Signed)
Urinary Tract Infection °Urinary tract infections (UTIs) can develop anywhere along your urinary tract. Your urinary tract is your body's drainage system for removing wastes and extra water. Your urinary tract includes two kidneys, two ureters, a bladder, and a urethra. Your kidneys are a pair of bean-shaped organs. Each kidney is about the size of your fist. They are located below your ribs, one on each side of your spine. °CAUSES °Infections are caused by microbes, which are microscopic organisms, including fungi, viruses, and bacteria. These organisms are so small that they can only be seen through a microscope. Bacteria are the microbes that most commonly cause UTIs. °SYMPTOMS  °Symptoms of UTIs may vary by age and gender of the patient and by the location of the infection. Symptoms in young women typically include a frequent and intense urge to urinate and a painful, burning feeling in the bladder or urethra during urination. Older women and men are more likely to be tired, shaky, and weak and have muscle aches and abdominal pain. A fever may mean the infection is in your kidneys. Other symptoms of a kidney infection include pain in your back or sides below the ribs, nausea, and vomiting. °DIAGNOSIS °To diagnose a UTI, your caregiver will ask you about your symptoms. Your caregiver will also ask you to provide a urine sample. The urine sample will be tested for bacteria and white blood cells. White blood cells are made by your body to help fight infection. °TREATMENT  °Typically, UTIs can be treated with medication. Because most UTIs are caused by a bacterial infection, they usually can be treated with the use of antibiotics. The choice of antibiotic and length of treatment depend on your symptoms and the type of bacteria causing your infection. °HOME CARE INSTRUCTIONS °· If you were prescribed antibiotics, take them exactly as your caregiver instructs you. Finish the medication even if you feel better after  you have only taken some of the medication. °· Drink enough water and fluids to keep your urine clear or pale yellow. °· Avoid caffeine, tea, and carbonated beverages. They tend to irritate your bladder. °· Empty your bladder often. Avoid holding urine for long periods of time. °· Empty your bladder before and after sexual intercourse. °· After a bowel movement, women should cleanse from front to back. Use each tissue only once. °SEEK MEDICAL CARE IF:  °· You have back pain. °· You develop a fever. °· Your symptoms do not begin to resolve within 3 days. °SEEK IMMEDIATE MEDICAL CARE IF:  °· You have severe back pain or lower abdominal pain. °· You develop chills. °· You have nausea or vomiting. °· You have continued burning or discomfort with urination. °MAKE SURE YOU:  °· Understand these instructions. °· Will watch your condition. °· Will get help right away if you are not doing well or get worse. °  °This information is not intended to replace advice given to you by your health care provider. Make sure you discuss any questions you have with your health care provider. °  °Document Released: 05/02/2005 Document Revised: 04/13/2015 Document Reviewed: 08/31/2011 °Elsevier Interactive Patient Education ©2016 Elsevier Inc. ° °Fall Prevention in Hospitals, Adult °As a hospital patient, your condition and the treatments you receive can increase your risk for falls. Some additional risk factors for falls in a hospital include: °· Being in an unfamiliar environment. °· Being on bed rest. °· Your surgery. °· Taking certain medicines. °· Your tubing requirements, such as intravenous (IV) therapy or   catheters. °It is important that you learn how to decrease fall risks while at the hospital. Below are important tips that can help prevent falls. °SAFETY TIPS FOR PREVENTING FALLS °Talk about your risk of falling. °· Ask your health care provider why you are at risk for falling. Is it your medicine, illness, tubing placement, or  something else? °· Make a plan with your health care provider to keep you safe from falls. °· Ask your health care provider or pharmacist about side effects of your medicines. Some medicines can make you dizzy or affect your coordination. °Ask for help. °· Ask for help before getting out of bed. You may need to press your call button. °· Ask for assistance in getting safely to the toilet. °· Ask for a walker or cane to be put at your bedside. Ask that most of the side rails on your bed be placed up before your health care provider leaves the room. °· Ask family or friends to sit with you. °· Ask for things that are out of your reach, such as your glasses, hearing aids, telephone, bedside table, or call button. °Follow these tips to avoid falling: °· Stay lying or seated, rather than standing, while waiting for help. °· Wear rubber-soled slippers or shoes whenever you walk in the hospital. °· Avoid quick, sudden movements. °¨ Change positions slowly. °¨ Sit on the side of your bed before standing. °¨ Stand up slowly and wait before you start to walk. °· Let your health care provider know if there is a spill on the floor. °· Pay careful attention to the medical equipment, electrical cords, and tubes around you. °· When you need help, use your call button by your bed or in the bathroom. Wait for one of your health care providers to help you. °· If you feel dizzy or unsure of your footing, return to bed and wait for assistance. °· Avoid being distracted by the TV, telephone, or another person in your room. °· Do not lean or support yourself on rolling objects, such as IV poles or bedside tables. °  °This information is not intended to replace advice given to you by your health care provider. Make sure you discuss any questions you have with your health care provider. °  °Document Released: 07/20/2000 Document Revised: 08/13/2014 Document Reviewed: 03/30/2012 °Elsevier Interactive Patient Education ©2016 Elsevier  Inc. ° °

## 2015-05-25 NOTE — ED Notes (Signed)
Pt's [facility]

## 2015-05-28 LAB — URINE CULTURE
Culture: >=100000
Special Requests: NORMAL

## 2015-05-29 ENCOUNTER — Telehealth (HOSPITAL_COMMUNITY): Payer: Self-pay

## 2015-05-29 NOTE — Telephone Encounter (Signed)
Post ED Visit - Positive Culture Follow-up  Culture report reviewed by antimicrobial stewardship pharmacist:  []  Celedonio MiyamotoJeremy Frens, Pharm.D., BCPS []  Georgina PillionElizabeth Martin, Pharm.D., BCPS []  Sleepy HollowMinh Pham, [address]Ph[address]D., BCPS, AAHIVP []  Estella HuskMichelle Turner, Pharm.D., BCPS, AAHIVP []  Colgate PalmoliveCristy Reyes, [address]arm[address] [x]  Tennis Mustassie Stewart, Pharm.D.  Positive urine culture Treated with cephalexin, organism sensitive to the same and no further patient follow-up is required at this time.  Ashley JacobsFesterman, Nia Nathaniel C 05/29/2015, 12:02 PM

## 2015-09-26 ENCOUNTER — Emergency Department (HOSPITAL_COMMUNITY)
Admission: EM | Admit: 2015-09-26 | Discharge: 2015-09-26 | Disposition: A | Discharge status: Home / Self Care | Payer: Medicare Other | Source: Home / Self Care

## 2015-09-26 ENCOUNTER — Emergency Department (HOSPITAL_COMMUNITY)
Admission: EM | Admit: 2015-09-26 | Discharge: 2015-09-27 | Disposition: A | Discharge status: Home / Self Care | Payer: Medicare Other | Attending: Emergency Medicine | Admitting: Emergency Medicine

## 2015-09-26 ENCOUNTER — Encounter (HOSPITAL_COMMUNITY): Payer: Self-pay

## 2015-09-26 DIAGNOSIS — Z8673 Personal history of transient ischemic attack (TIA), and cerebral infarction without residual deficits: Secondary | ICD-10-CM | POA: Insufficient documentation

## 2015-09-26 DIAGNOSIS — T7421XA Adult sexual abuse, confirmed, initial encounter: Secondary | ICD-10-CM | POA: Diagnosis not present

## 2015-09-26 DIAGNOSIS — F0390 Unspecified dementia without behavioral disturbance: Secondary | ICD-10-CM | POA: Insufficient documentation

## 2015-09-26 DIAGNOSIS — Z792 Long term (current) use of antibiotics: Secondary | ICD-10-CM | POA: Diagnosis not present

## 2015-09-26 DIAGNOSIS — I10 Essential (primary) hypertension: Secondary | ICD-10-CM | POA: Diagnosis not present

## 2015-09-26 DIAGNOSIS — Z79899 Other long term (current) drug therapy: Secondary | ICD-10-CM | POA: Diagnosis not present

## 2015-09-26 DIAGNOSIS — Y998 Other external cause status: Secondary | ICD-10-CM | POA: Diagnosis not present

## 2015-09-26 DIAGNOSIS — Z872 Personal history of diseases of the skin and subcutaneous tissue: Secondary | ICD-10-CM | POA: Insufficient documentation

## 2015-09-26 DIAGNOSIS — S93401A Sprain of unspecified ligament of right ankle, initial encounter: Secondary | ICD-10-CM | POA: Insufficient documentation

## 2015-09-26 DIAGNOSIS — E119 Type 2 diabetes mellitus without complications: Secondary | ICD-10-CM | POA: Insufficient documentation

## 2015-09-26 DIAGNOSIS — Y9289 Other specified places as the place of occurrence of the external cause: Secondary | ICD-10-CM | POA: Diagnosis not present

## 2015-09-26 DIAGNOSIS — Z8669 Personal history of other diseases of the nervous system and sense organs: Secondary | ICD-10-CM | POA: Insufficient documentation

## 2015-09-26 DIAGNOSIS — Y9389 Activity, other specified: Secondary | ICD-10-CM | POA: Diagnosis not present

## 2015-09-26 DIAGNOSIS — Z880 Allergy status to penicillin: Secondary | ICD-10-CM | POA: Insufficient documentation

## 2015-09-26 DIAGNOSIS — Z0441 Encounter for examination and observation following alleged adult rape: Secondary | ICD-10-CM | POA: Diagnosis not present

## 2015-09-26 DIAGNOSIS — W1840XA Slipping, tripping and stumbling without falling, unspecified, initial encounter: Secondary | ICD-10-CM | POA: Diagnosis not present

## 2015-09-26 NOTE — ED Notes (Signed)
Patient left without being seen.
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
## 2015-09-27 ENCOUNTER — Ambulatory Visit (HOSPITAL_COMMUNITY)
Admission: EM | Admit: 2015-09-27 | Discharge: 2015-09-27 | Disposition: A | Discharge status: Home / Self Care | Payer: No Typology Code available for payment source | Source: Ambulatory Visit | Attending: Emergency Medicine | Admitting: Emergency Medicine

## 2015-09-27 ENCOUNTER — Encounter (HOSPITAL_COMMUNITY): Payer: Self-pay | Admitting: Emergency Medicine

## 2015-09-27 ENCOUNTER — Emergency Department (HOSPITAL_COMMUNITY): Payer: Medicare Other

## 2015-09-27 DIAGNOSIS — E119 Type 2 diabetes mellitus without complications: Secondary | ICD-10-CM | POA: Diagnosis not present

## 2015-09-27 DIAGNOSIS — I10 Essential (primary) hypertension: Secondary | ICD-10-CM | POA: Diagnosis not present

## 2015-09-27 DIAGNOSIS — E785 Hyperlipidemia, unspecified: Secondary | ICD-10-CM | POA: Insufficient documentation

## 2015-09-27 DIAGNOSIS — F0390 Unspecified dementia without behavioral disturbance: Secondary | ICD-10-CM | POA: Insufficient documentation

## 2015-09-27 DIAGNOSIS — Z8673 Personal history of transient ischemic attack (TIA), and cerebral infarction without residual deficits: Secondary | ICD-10-CM | POA: Insufficient documentation

## 2015-09-27 DIAGNOSIS — Z0441 Encounter for examination and observation following alleged adult rape: Secondary | ICD-10-CM | POA: Diagnosis present

## 2015-09-27 DIAGNOSIS — Z79899 Other long term (current) drug therapy: Secondary | ICD-10-CM | POA: Insufficient documentation

## 2015-09-27 MED ORDER — AZITHROMYCIN 250 MG PO TABS: ORAL_TABLET | ORAL | Status: DC

## 2015-09-27 MED ORDER — AZITHROMYCIN 250 MG PO TABS: ORAL_TABLET | ORAL | Status: AC

## 2015-09-27 MED ORDER — METRONIDAZOLE 500 MG PO TABS: 2000.0000 mg | ORAL_TABLET | Freq: Once | ORAL | Status: AC

## 2015-09-27 NOTE — ED Provider Notes (Signed)
Asked to write for flagyl, and azithromycin as directed by SANE nurse.  Daughter states patient is only on lisinopril and an Multivitamin and no QT prolonging agents  Jaikob Borgwardt, MD 09/27/15 [phone]

## 2015-09-27 NOTE — ED Notes (Signed)
SANE nurse arrived.

## 2015-09-27 NOTE — ED Notes (Signed)
SANE nurse in room with pt
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
## 2015-09-27 NOTE — ED Notes (Signed)
SANE nurse has been paged by Diplomatic Services operational officer

## 2015-09-27 NOTE — ED Notes (Signed)
Pt daughter/POA at bedside

## 2016-07-14 ENCOUNTER — Emergency Department (HOSPITAL_COMMUNITY): Payer: Commercial Managed Care - HMO

## 2016-07-14 ENCOUNTER — Encounter (HOSPITAL_COMMUNITY): Payer: Self-pay

## 2016-07-14 ENCOUNTER — Emergency Department (HOSPITAL_COMMUNITY)
Admission: EM | Admit: 2016-07-14 | Discharge: 2016-07-14 | Disposition: A | Discharge status: Home / Self Care | Payer: Commercial Managed Care - HMO | Attending: Emergency Medicine | Admitting: Emergency Medicine

## 2016-07-14 DIAGNOSIS — Z79899 Other long term (current) drug therapy: Secondary | ICD-10-CM | POA: Diagnosis not present

## 2016-07-14 DIAGNOSIS — I10 Essential (primary) hypertension: Secondary | ICD-10-CM | POA: Insufficient documentation

## 2016-07-14 DIAGNOSIS — Z8673 Personal history of transient ischemic attack (TIA), and cerebral infarction without residual deficits: Secondary | ICD-10-CM | POA: Diagnosis not present

## 2016-07-14 DIAGNOSIS — R404 Transient alteration of awareness: Secondary | ICD-10-CM | POA: Insufficient documentation

## 2016-07-14 DIAGNOSIS — R4182 Altered mental status, unspecified: Secondary | ICD-10-CM | POA: Diagnosis present

## 2016-07-14 DIAGNOSIS — R739 Hyperglycemia, unspecified: Secondary | ICD-10-CM

## 2016-07-14 LAB — COMPREHENSIVE METABOLIC PANEL
ALBUMIN: 3.3 g/dL — AB (ref 3.5–5.0)
ALK PHOS: 116 U/L (ref 38–126)
ALT: 10 U/L — AB (ref 14–54)
ANION GAP: 13 (ref 5–15)
AST: 19 U/L (ref 15–41)
BUN: 9 mg/dL (ref 6–20)
CALCIUM: 9.5 mg/dL (ref 8.9–10.3)
CHLORIDE: 101 mmol/L (ref 101–111)
CO2: 22 mmol/L (ref 22–32)
Creatinine, Ser: 0.97 mg/dL (ref 0.44–1.00)
GFR calc Af Amer: >60 mL/min (ref >60)
GFR calc non Af Amer: 55 mL/min — ABNORMAL LOW (ref >60)
GLUCOSE: 310 mg/dL — AB (ref 65–99)
Potassium: 4 mmol/L (ref 3.5–5.1)
SODIUM: 136 mmol/L (ref 135–145)
Total Bilirubin: 0.5 mg/dL (ref 0.3–1.2)
Total Protein: 7 g/dL (ref 6.5–8.1)

## 2016-07-14 LAB — URINALYSIS, ROUTINE W REFLEX MICROSCOPIC
BACTERIA UA: NONE SEEN
BILIRUBIN URINE: NEGATIVE
Hgb urine dipstick: NEGATIVE
KETONES UR: NEGATIVE mg/dL
Leukocytes, UA: NEGATIVE
NITRITE: NEGATIVE
PROTEIN: NEGATIVE mg/dL
Specific Gravity, Urine: 1.016 (ref 1.005–1.030)
pH: 6 (ref 5.0–8.0)

## 2016-07-14 LAB — TROPONIN I: Troponin I: <0.03 ng/mL (ref <0.03)

## 2016-07-14 LAB — CBG MONITORING, ED: Glucose-Capillary: 290 mg/dL — ABNORMAL HIGH (ref 65–99)

## 2016-07-14 NOTE — ED Provider Notes (Signed)
[facility]-EMERGENCY DEPT Provider Note   CSN: [id number] Arrival date & time: 07/14/16  1527     History   Chief Complaint Chief Complaint  Patient presents with  . Altered Mental Status    as per the facility the pt appeared altered at the NH pt does not have any neuro deficits at this time pt is hyperglycemic has new hx of taking metformin    HPI Dorothy Page is a 80 y.o. female. She presents for evaluation from her extended care facility. Apparently, per staff there, the patient was confused today. Changes noted approximately noon. Patient arrives here via EMS and accompanied. Her daughter is en route  HPI  Past Medical History:  Diagnosis Date  . Allergy   . Dementia   . Diabetes mellitus   . Encephalopathy   . Hyperlipidemia    borderline  . Hypertension   . Neuromuscular disorder (HCC)    mild paralysis  . Sebaceous cyst   . Stroke (HCC)   . Subdural hematoma [facility](HCC)     Patient Active Problem List   Diagnosis Date Noted  . Subdural hematoma (HCC) 12/19/2014  . Acute encephalopathy 12/19/2014  . Type 2 diabetes mellitus with hyperglycemia (HCC) 12/19/2014  . Essential hypertension 12/19/2014  . Subacute subdural hematoma (HCC)   . Infected sebaceous cyst 02/09/2011  . ANAL PRURITUS 12/24/2007  . DIARRHEA 12/24/2007  . DIABETES 12/22/2007  . HYPERTENSION 12/22/2007  . STROKE 12/22/2007    Past Surgical History:  Procedure Laterality Date  . ABDOMINAL HYSTERECTOMY    . COLONOSCOPY    . CYSTECTOMY      OB History    No data available       Home Medications    Prior to Admission medications   Medication Sig Start Date End Date Taking? Authorizing Provider  amLODipine (NORVASC) 2.5 MG tablet Take 1 tablet (2.5 mg total) by mouth daily. 12/21/14   Belkys A Regalado, MD  azithromycin (ZITHROMAX Z-PAK) 250 MG tablet 2 po day one, then 1 daily x 4 days 09/27/15   April Palumbo, MD  cephALEXin (KEFLEX) 500 MG capsule Take 1 capsule (500 mg total) by mouth  2 (two) times daily. 05/25/15   Arthor CaptainAbigail Harris, PA-C  Cholecalciferol (VITAMIN D-3) 1000 UNITS CAPS Take 1 capsule by mouth daily.    Historical Provider, MD  Cyanocobalamin (VITAMIN B 12 PO) Take 1,000 mg by mouth daily.    Historical Provider, MD  escitalopram (LEXAPRO) 5 MG tablet Take 5 mg by mouth every morning. 11/20/14   Historical Provider, MD  irbesartan (AVAPRO) 75 MG tablet Take 75 mg by mouth daily. 11/20/14   Historical Provider, MD  metroNIDAZOLE (FLAGYL) 500 MG tablet Take 4 tablets (2,000 mg total) by mouth once. 09/27/15   April Palumbo, MD  mirtazapine (REMERON) 30 MG tablet Take 30 mg by mouth at bedtime. Appetite stimulant.    Historical Provider, MD  NAMENDA XR 28 MG CP24 24 hr capsule Take 28 mg by mouth daily. 11/20/14   Historical Provider, MD  polyethylene glycol (MIRALAX / GLYCOLAX) packet Take 17 g by mouth daily.    Historical Provider, MD    Family History Family History  Problem Relation Age of Onset  . Heart disease Mother   . Diabetes Father   . Diabetes Brother     Social History Social History  Substance Use Topics  . Smoking status: Never Smoker  . Smokeless tobacco: Never Used  . Alcohol use No     Allergies  Penicillins and Aspirin   Review of Systems Review of Systems  Unable to perform ROS: Dementia     Physical Exam Updated Vital Signs BP 148/83 (BP Location: Left Arm)   Pulse 86   Temp 98.3 F (36.8 C) (Oral)   Resp 20   Ht 5\' 6"  (1.676 m)   Wt 150 lb (68 kg)   SpO2 98%   BMI 24.21 kg/m   Physical Exam  Constitutional: She appears well-developed and well-nourished. No distress.  HENT:  Head: Normocephalic.  Eyes: Conjunctivae are normal. Pupils are equal, round, and reactive to light. No scleral icterus.  Neck: Normal range of motion. Neck supple. No thyromegaly present.  Cardiovascular: Normal rate and regular rhythm.  Exam reveals no gallop and no friction rub.   No murmur heard. Pulmonary/Chest: Effort normal and  breath sounds normal. No respiratory distress. She has no wheezes. She has no rales.  Abdominal: Soft. Bowel sounds are normal. She exhibits no distension. There is no tenderness. There is no rebound.  Musculoskeletal: Normal range of motion.  Neurological: She is alert.  No pronator drift. No leg drift. No cranial nerve deficits. Poor short-term memory.  Skin: Skin is warm and dry. No rash noted.  Psychiatric: She has a normal mood and affect. Her behavior is normal.     ED Treatments / Results  Labs (all labs ordered are listed, but only abnormal results are displayed) Labs Reviewed  COMPREHENSIVE METABOLIC PANEL - Abnormal; Notable for the following:       Result Value   Glucose, Bld 310 (*)    Albumin 3.3 (*)    ALT 10 (*)    GFR calc non Af Amer 55 (*)    All other components within normal limits  URINALYSIS, ROUTINE W REFLEX MICROSCOPIC - Abnormal; Notable for the following:    Glucose, UA >=500 (*)    Squamous Epithelial / LPF 0-5 (*)    All other components within normal limits  CBG MONITORING, ED - Abnormal; Notable for the following:    Glucose-Capillary 290 (*)    All other components within normal limits  TROPONIN I  CBC WITH DIFFERENTIAL/PLATELET    EKG  EKG Interpretation None       Radiology Dg Chest Port 1 View  Result Date: 07/14/2016 CLINICAL DATA:  Confusion, history diabetes mellitus, hypertension, neuromuscular disorder EXAM: PORTABLE CHEST 1 VIEW COMPARISON:  Portable exam 1635 hours compared to 12/19/2014 FINDINGS: Normal heart size, mediastinal contours, and pulmonary vascularity. Mild bibasilar atelectasis. Lungs otherwise clear. No pleural effusion or pneumothorax. Bones demineralized. IMPRESSION: Mild bibasilar atelectasis. Electronically Signed   By: Ulyses Southward M.D.   On: 07/14/2016 16:56    Procedures Procedures (including critical care time)  Medications Ordered in ED Medications - No data to display   Initial Impression / Assessment  and Plan / ED Course  I have reviewed the triage vital signs and the nursing notes.  Pertinent labs & imaging results that were available during my care of the patient were reviewed by me and considered in my medical decision making (see chart for details).  Clinical Course    Dementia. Uncertain about acute delirium. Plan for evaluation. 8 arrival of family.  17:51:  His right. She feels her mother is at her baseline. She feels strongly that she does not want CT imaging. She states if this were stroke or hemorrhage that she would not want any additional interventions done. I think this is a reasonable approach. Remainder of her  studies show no acute abnormalities. Daughter does not notice abnormalities in her mother and feels she is clearly at her baseline.  Final Clinical Impressions(s) / ED Diagnoses   Final diagnoses:  Transient alteration of awareness    New Prescriptions New Prescriptions   No medications on file     Rolland PorterMark Coleston Dirosa, MD 07/14/16 1751

## 2016-07-14 NOTE — Discharge Instructions (Addendum)
Routine follow up with primary care.  Current diabetic medication management

## 2016-07-14 NOTE — ED Notes (Addendum)
[facility] here to pick up pt, daughter of pt. Very upset regarding no treatment of her mothers blood sugar, ER MD made aware no change in disposition or orders.  Family member stating that she will be in contact with ER Director.  This RN spoke with daughter regarding sugar suggesting that we could have the MD speak with daughter, pt's daughter declined.  [facility] here to pick up pt.  Pt in  No distress at time of discharge

## 2016-07-14 NOTE — ED Notes (Signed)
[facility] contacted [facility] [place]

## 2016-07-14 NOTE — ED Triage Notes (Signed)
[facility] around 12 noon today

## 2016-07-16 IMAGING — CR DG ANKLE COMPLETE 3+V*R*
3 series · 3 of 3 positions shown · non-contrast
Comparison: None.

CLINICAL DATA: Twisting injury to right ankle, with right lateral
ankle and foot pain and swelling. Initial encounter.

EXAM:
RIGHT ANKLE - COMPLETE 3+ VIEW

[x ankle ap right]
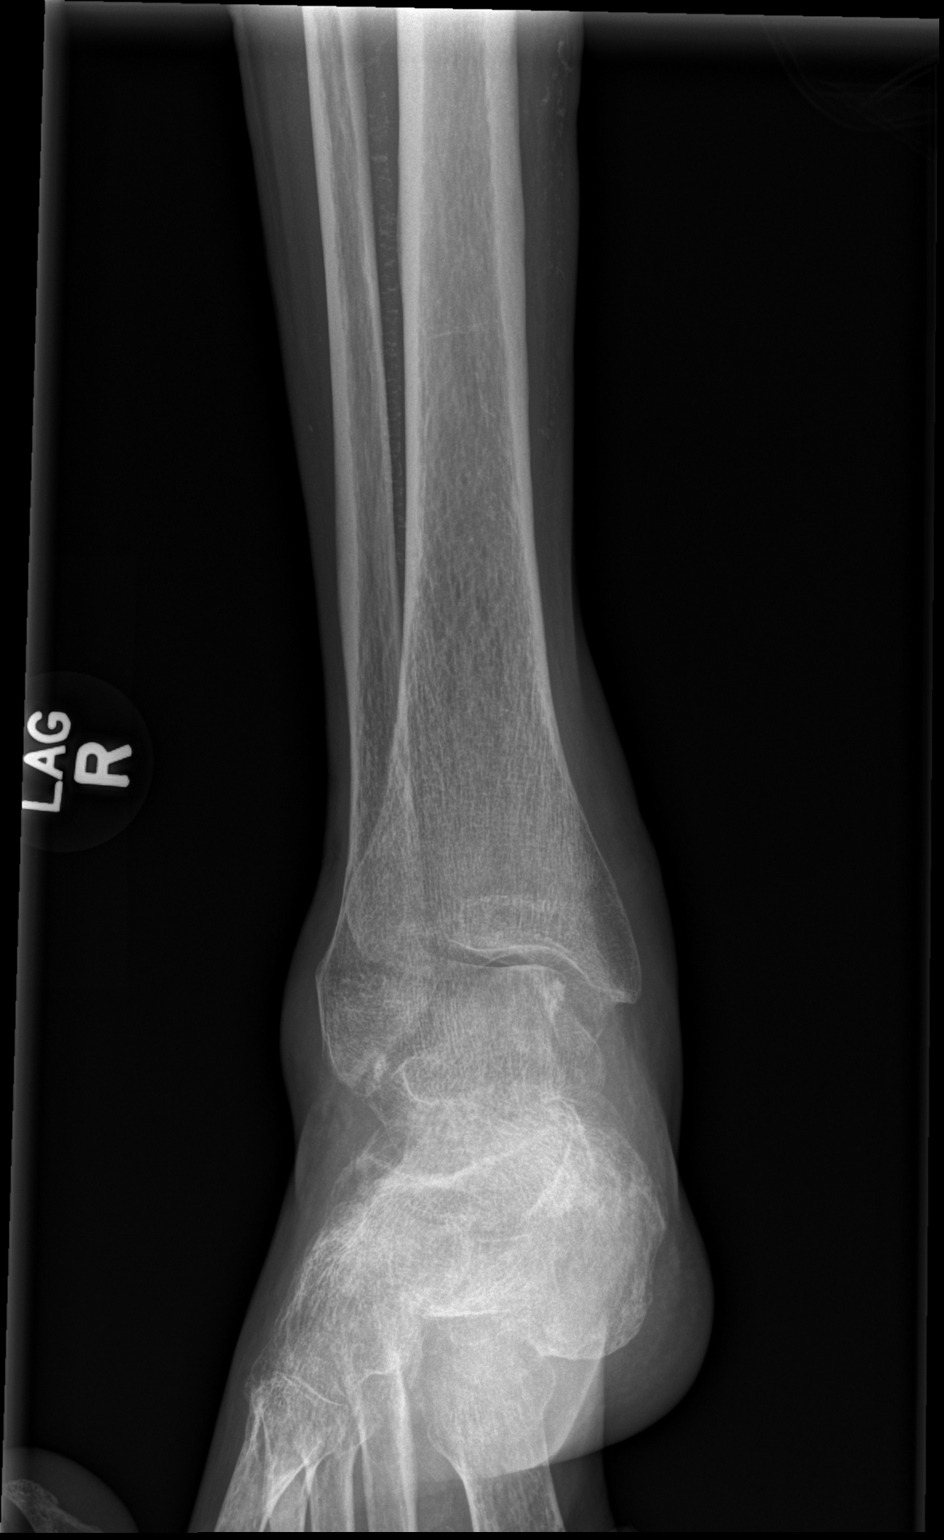

[x ankle lat right]
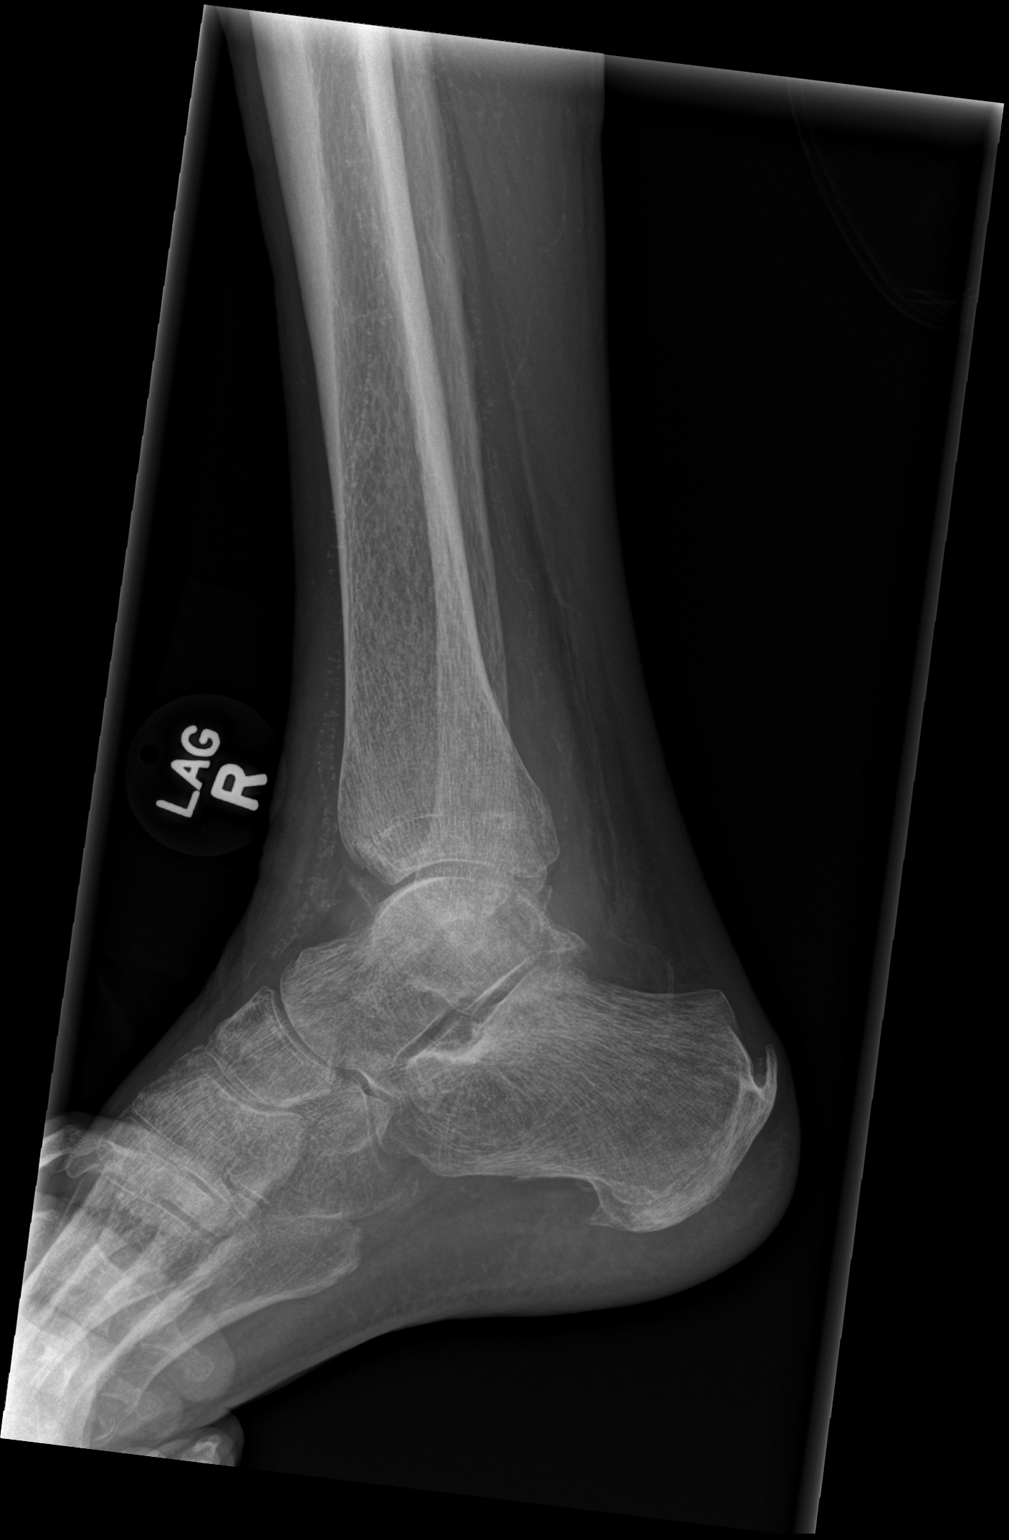

[x ankle obl right]
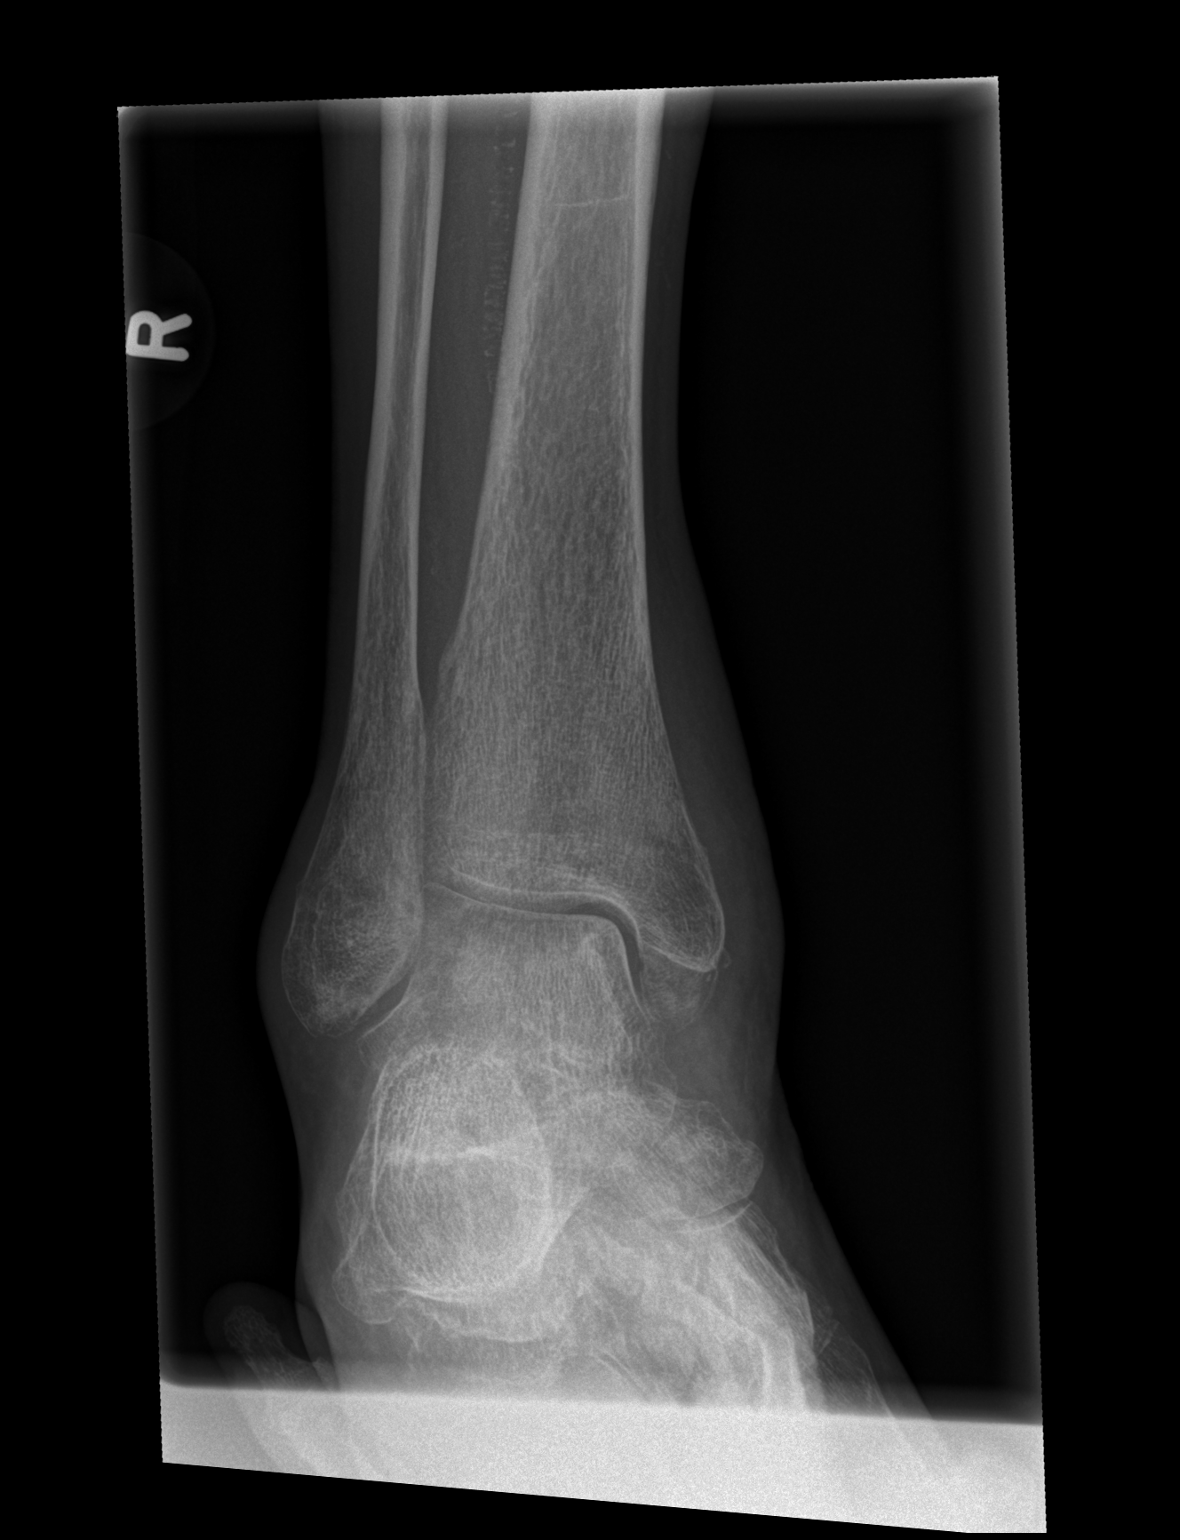

[3 of 3 positions shown; findings below may reference images not displayed]

FINDINGS: A small osseous fragment anterior to the anterior tibial plafond may
reflect a small avulsion fracture or may be degenerative in nature.

There is diffuse osteopenia of visualized osseous structures.
Plantar and posterior calcaneal spurs are seen. The ankle mortise is
intact; the interosseous space is within normal limits. No talar
tilt or subluxation is seen.

The joint spaces are preserved. Mild diffuse soft tissue swelling is
noted about the ankle.
IMPRESSION: 1. Small apparent osseous fragment anterior to the anterior tibial
plafond may reflect a small avulsion fracture or may be degenerative
in nature. Would correlate for any associated symptoms.
2. Diffuse osteopenia of visualized osseous structures.

## 2016-11-04 DEATH — deceased

## 2017-05-03 IMAGING — CR DG CHEST 1V PORT
1 series · 1 of 1 positions shown · non-contrast
Comparison: Portable exam 0811 hours compared to 12/19/2014

CLINICAL DATA: Confusion, history diabetes mellitus, hypertension,
neuromuscular disorder

EXAM:
PORTABLE CHEST 1 VIEW

[AP]
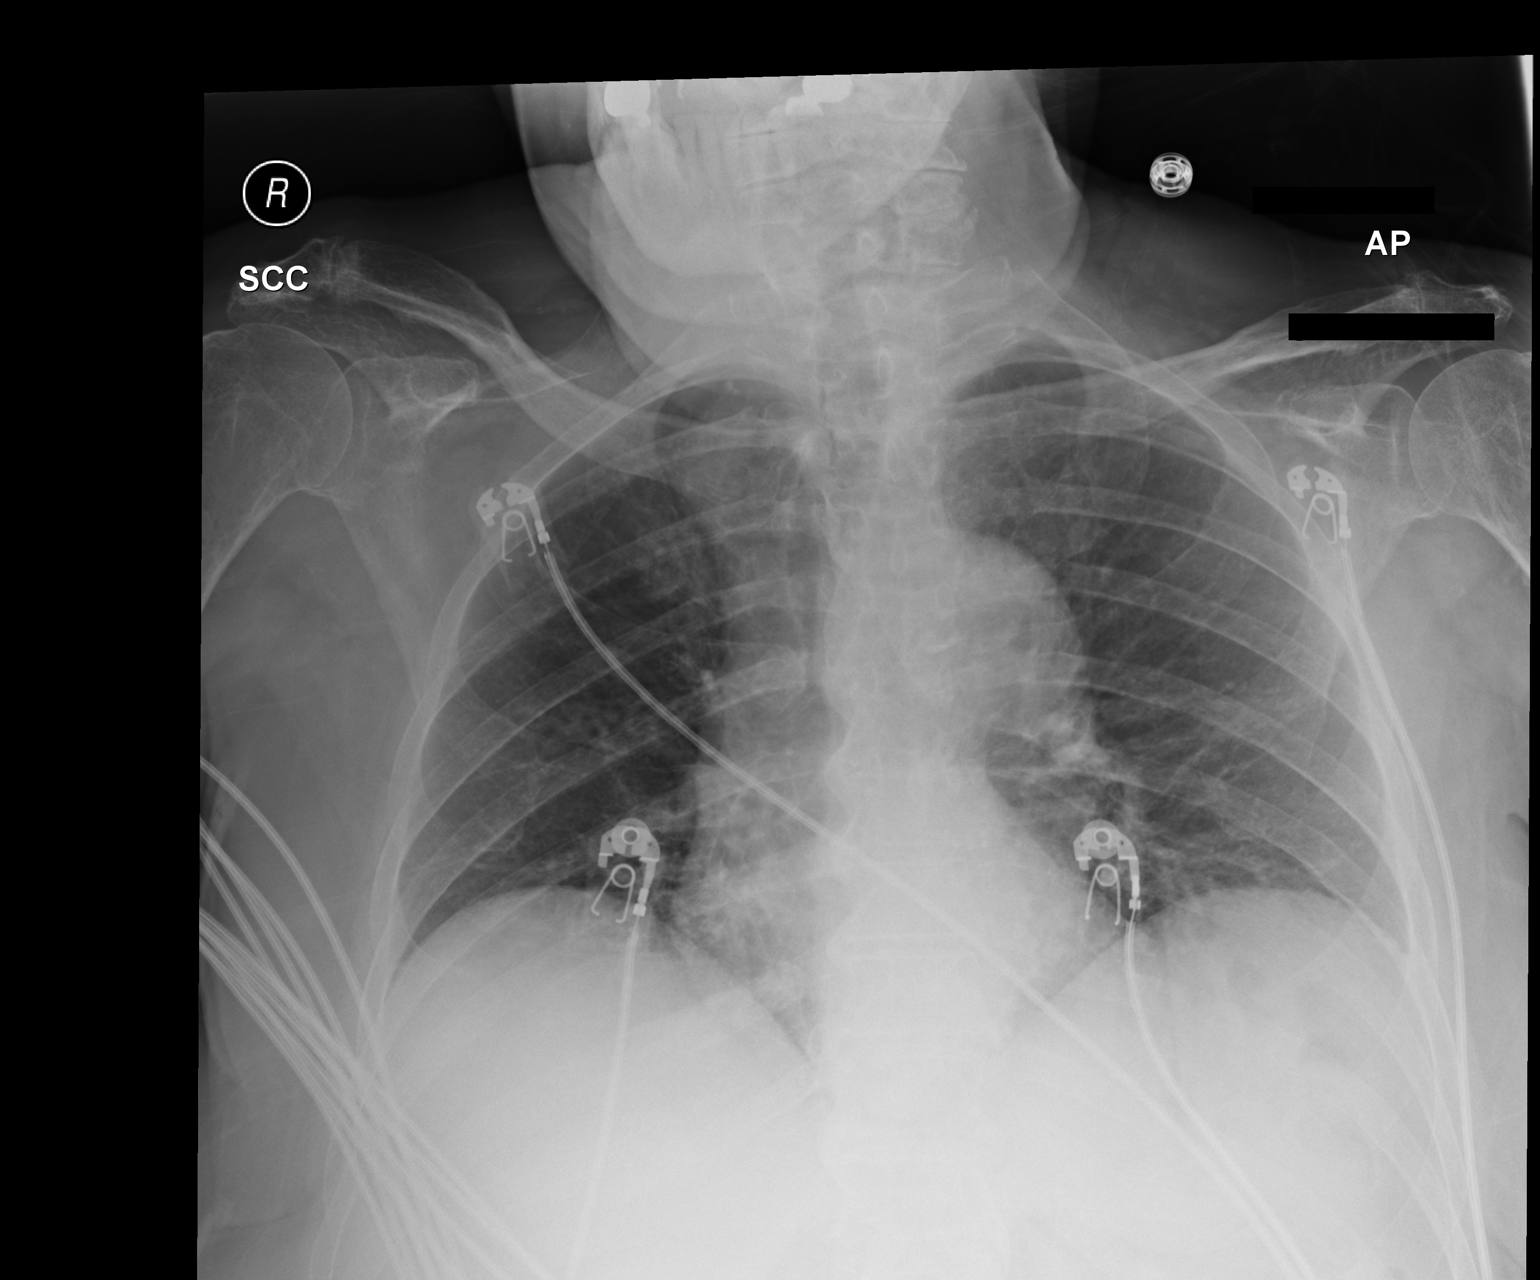

[1 of 1 positions shown; findings below may reference images not displayed]

FINDINGS: Normal heart size, mediastinal contours, and pulmonary vascularity.

Mild bibasilar atelectasis.

Lungs otherwise clear.

No pleural effusion or pneumothorax.

Bones demineralized.
IMPRESSION: Mild bibasilar atelectasis.

## 2017-11-04 ENCOUNTER — Encounter: Payer: Self-pay | Admitting: Gastroenterology
